# Patient Record
Sex: Male | Born: 1985 | Race: White | Hispanic: No | Marital: Single | Smoking: Current every day smoker
Health system: Southern US, Community
[De-identification: ages and names within clinical notes are randomized; demographics above are authoritative.]

## PROBLEM LIST (undated history)

## (undated) DIAGNOSIS — F319 Bipolar disorder, unspecified: Secondary | ICD-10-CM

---

## 2015-04-16 ENCOUNTER — Emergency Department (HOSPITAL_BASED_OUTPATIENT_CLINIC_OR_DEPARTMENT_OTHER)
Admission: EM | Admit: 2015-04-16 | Discharge: 2015-04-16 | Discharge status: Against Medical Advice | Payer: Self-pay | Attending: Emergency Medicine | Admitting: Emergency Medicine

## 2015-04-16 ENCOUNTER — Encounter (HOSPITAL_BASED_OUTPATIENT_CLINIC_OR_DEPARTMENT_OTHER): Payer: Self-pay | Admitting: Emergency Medicine

## 2015-04-16 DIAGNOSIS — R51 Headache: Secondary | ICD-10-CM | POA: Insufficient documentation

## 2015-04-16 DIAGNOSIS — R1110 Vomiting, unspecified: Secondary | ICD-10-CM | POA: Insufficient documentation

## 2015-04-16 DIAGNOSIS — Z720 Tobacco use: Secondary | ICD-10-CM | POA: Insufficient documentation

## 2015-04-16 HISTORY — DX: Bipolar disorder, unspecified: F31.9

## 2015-04-16 NOTE — ED Notes (Signed)
Patient states that he has a "Migraine Headache". Reports that he has vomited. The patient denies a history of MHA. Patient denies anything that makes it worse or better.

## 2015-04-16 NOTE — ED Notes (Signed)
Went to round on patient. Pt not in room. Registration staff stated that patient left.

## 2015-04-23 ENCOUNTER — Encounter (HOSPITAL_BASED_OUTPATIENT_CLINIC_OR_DEPARTMENT_OTHER): Payer: Self-pay | Admitting: Emergency Medicine

## 2015-04-23 ENCOUNTER — Emergency Department (HOSPITAL_BASED_OUTPATIENT_CLINIC_OR_DEPARTMENT_OTHER)
Admission: EM | Admit: 2015-04-23 | Discharge: 2015-04-23 | Disposition: A | Discharge status: Home / Self Care | Payer: Self-pay | Attending: Emergency Medicine | Admitting: Emergency Medicine

## 2015-04-23 DIAGNOSIS — Z79899 Other long term (current) drug therapy: Secondary | ICD-10-CM | POA: Insufficient documentation

## 2015-04-23 DIAGNOSIS — Z720 Tobacco use: Secondary | ICD-10-CM | POA: Insufficient documentation

## 2015-04-23 DIAGNOSIS — F319 Bipolar disorder, unspecified: Secondary | ICD-10-CM | POA: Insufficient documentation

## 2015-04-23 DIAGNOSIS — K029 Dental caries, unspecified: Secondary | ICD-10-CM | POA: Insufficient documentation

## 2015-04-23 DIAGNOSIS — K0889 Other specified disorders of teeth and supporting structures: Secondary | ICD-10-CM | POA: Insufficient documentation

## 2015-04-23 MED ORDER — BUPIVACAINE-EPINEPHRINE (PF) 0.5% -1:200000 IJ SOLN
10.0000 mL | Freq: Once | INTRAMUSCULAR | Status: AC
  Administered 2015-04-23: 10 mL
  Filled 2015-04-23: qty 10.8

## 2015-04-23 MED ORDER — BUPIVACAINE HCL (PF) 0.5 % IJ SOLN
10.0000 mL | Freq: Once | INTRAMUSCULAR | Status: DC
  Filled 2015-04-23: qty 10

## 2015-04-23 MED ORDER — BUPIVACAINE-EPINEPHRINE (PF) 0.5% -1:200000 IJ SOLN
INTRAMUSCULAR | Status: AC
Start: 2015-04-23 — End: 2015-04-23
  Administered 2015-04-23: 10 mL
  Filled 2015-04-23: qty 1.8

## 2015-04-23 MED ORDER — PENICILLIN V POTASSIUM 500 MG PO TABS: 500.0000 mg | ORAL_TABLET | Freq: Two times a day (BID) | ORAL | Status: AC

## 2015-04-23 NOTE — ED Notes (Signed)
Pt reports dental pain to left back tooth x 8 days

## 2015-04-23 NOTE — ED Notes (Addendum)
C/o left lower back tooth pain, face feels tight, has tried Excedrin and Midwife for pain relief, but pain relief is minimal.

## 2015-04-23 NOTE — Discharge Instructions (Signed)
Please take your prescription as prescribed. Please follow up with your dentist or one listed below. Please return to the Emergency Department if symptoms worsen or new onset of fever, facial swelling, unable to swallow, difficulty breathing.   Emergency Department Resource Guide 1) Find a Doctor and Pay Out of Pocket Although you won't have to find out who is covered by your insurance plan, it is a good idea to ask around and get recommendations. You will then need to call the office and see if the doctor you have chosen will accept you as a new patient and what types of options they offer for patients who are self-pay. Some doctors offer discounts or will set up payment plans for their patients who do not have insurance, but you will need to ask so you aren't surprised when you get to your appointment.  2) Contact Your Local Health Department Not all health departments have doctors that can see patients for sick visits, but many do, so it is worth a call to see if yours does. If you don't know where your local health department is, you can check in your phone book. The CDC also has a tool to help you locate your state's health department, and many state websites also have listings of all of their local health departments.  3) Find a Walk-in Clinic If your illness is not likely to be very severe or complicated, you may want to try a walk in clinic. These are popping up all over the country in pharmacies, drugstores, and shopping centers. They're usually staffed by nurse practitioners or physician assistants that have been trained to treat common illnesses and complaints. They're usually fairly quick and inexpensive. However, if you have serious medical issues or chronic medical problems, these are probably not your best option.  No Primary Care Doctor: - Call Health Connect at  [phone] - they can help you locate a primary care doctor that  accepts your insurance, provides certain services,  etc. - Physician Referral Service- [phone]  Chronic Pain Problems: Organization         Address  Phone   Notes  [facility]  [phone] Patients need to be referred by their primary care doctor.   Medication Assistance: Organization         Address  Phone   Notes  [facility] Medication [facility] [address] [place] [place], [place] [address] [phone] --Must be a resident of [facility] -- Must have NO insurance coverage whatsoever (no Medicaid/ Medicare, etc.) -- The pt. MUST have a primary care doctor that directs their care regularly and follows them in the community   Med[facility]ist  [phone]   [employer]  [phone]    Agencies that provide inexpensive medical care: Organization         Address  Phone   Notes  [facility]  [phone]   [facility]    [phone]   [facility] [address] [place], [place] [address] [phone]   [facility] [address] [place]. [address] [place] [phone]   Planned Parenthood    ([phone]   [facility]    [phone]   [facility]  [address], [place] Phone:  [phone], Fax:  [phone] Hours of Operation:  9 am - 6 pm, M-F.  Also accepts Medicaid/Medicare and self-pay.  [facility] for [place] [address], [address], [place] Phone: [phone], Fax: [phone]. Hours of Operation:  8:30 am - 5:30 pm, M-F.  Also accepts Medicaid and self-pay.  [facility] [address], [place] Phone: [phone]   [place], [place], [place] [phone], Ext. [phone] Mondays & Thursdays: 7-9 AM.  First 15 patients are seen on a first come, first serve basis.    [place] Providers:  Organization         Address  Phone   Notes  [facility] [address], [place] [phone] Also accepts self-pay patients.  [facility] [address] [place], [place]  [phone]   [place], [address], [place] [phone]   [facility] [address] [place] [phone]   Lucianne Lei [address], [place]   [phone] Only accepts [place] Access [place] patients after they have their name applied to their card.   Self-Pay (no insurance) in [facility]:  Organization         Address  Phone   Notes  Sickle Cell Patients, [facility] [place] [phone]   [facility] Urgent Care [place] [phone]   [facility]  [place], [place], [place] [phone]   [facility]/Dr. Osei-Bonsu  [address], [place] or [address], [place] [phone] Phone number for both [place] and [place] locations is the same.  Urgent Medical and [facility] [address] [place] [phone]   [facility] [address], [place] or [address] [phone]   [facility] [address] [place] [phone], phone; [phone], fax Sees patients 1st and 3rd Saturday of every month.  Must not qualify for public or private insurance (i.e. Medicaid, Medicare, [place] Health Choice, Veterans' Benefits)  Household income should be no more than 200% of the poverty level The clinic cannot treat you if you are pregnant or think you are pregnant  Sexually transmitted diseases are not treated at the clinic.    Dental Care: Organization         Address  Phone  Notes  [facility] Department of [facility] [place] [phone] Accepts children up to  age 65 who are enrolled in [place] or [place]; pregnant women with a Medicaid card; and children who have applied for Medicaid or [place] Health Choice, but were declined, whose parents can pay a reduced fee at time of service.  [facility] Department of [facility]  [address], [place] [phone] Accepts children up to age 52 who are enrolled in [place] or [place]; pregnant women with a Medicaid card; and children who have applied for Medicaid or [place] Health Choice, but were declined, whose parents can pay a reduced fee at time of service.  [facility]  [place] [phone] Patients are seen by appointment only. Walk-ins are not accepted. [place] will see patients 71 years of age and older. Monday - Tuesday (8am-5pm) Most Wednesdays (8:30-5pm) $30 per visit,  cash only  [employer] Adult [employer] PROGRAM  [address], [place] [phone] Patients are seen by appointment only. Walk-ins are not accepted. [place] will see patients 33 years of age and older. One Wednesday Evening (Monthly: Volunteer Based).  $30 per visit, cash only  [place]  [phone] for adults; Children under age 46, call Graduate Pediatric Dentistry at [phone]. Children aged 6-14, please call [phone] to request a pediatric application.  Dental services are provided in all areas of dental care including fillings, crowns and bridges, complete and partial dentures, implants, gum treatment, root canals, and extractions. Preventive care is also provided. Treatment is provided to both adults and children. Patients are selected via a lottery and there is often a waiting list.   [facility] [address], [place]  [phone] [email or web address]   [facility] [address] [place], [place] [phone], Ext. [phone] Second and Fourth Thursday of  each month, opens at 6:30 AM; Clinic ends at 9 AM.  Patients are seen on a first-come first-served basis, and a limited number are seen during each clinic.   [facility]  [address] Hillard Danker [place], [place] [phone]   Eligibility Requirements You must have lived in [place], [place], or [place] counties for at least the last three months.   You cannot be eligible for state or federal sponsored [employer], including [employer], [place], or [employer].   You generally cannot be eligible for healthcare insurance through your employer.    How to apply: Eligibility screenings are held every Tuesday and Wednesday afternoon from 1:00 pm until 4:00 pm. You do not need an appointment for the interview!  [facility] [address], [place], [place]   [place]  [place] Department  [place]  [phone]    Behavioral Health Resources in the Community: Intensive Outpatient Programs Organization         Address  Phone  Notes  [place]. [address] [place], [place] [phone]   [facility] Outpatient [address] [place], [place]   ADS: [facility] Drug [facility] [address] [place], [place]   [place] [address]  [place], [place] or [phone]   Substance Abuse Resources Organization         Address  Phone  Notes  Alcohol and Drug Services  [phone]   [place]  [phone]   The [place]   Chinita Pester  [phone]   Residential & Outpatient Substance Abuse Program  [phone]   Psychological Services Organization         Address  Phone  Notes  [facility] [place]  [place]  [phone]   [place] [address]  [place] or [phone]    Mobile Crisis Teams Organization         Address  Phone  Notes  [facility], Mobile Crisis Care Unit  [phone]   [facility]ive Psychotherapeutic Services  [address] [place], [place]   [place] [address] [place] [phone]    Self-Help/Support Groups Organization         Address  Phone  Notes  Mental Health Assoc. of [place] - variety of support groups  336- I7437963 Call for more information  Narcotics Anonymous (NA), [facility]aring [facility] [address], [employer] [place]  2 meetings at this location   Statistician         Address  Phone  Notes  [facility] [address],    [place]  [phone]   [facility]  [address] [place] [address], [place], [place] [phone]   [facility] [address] [place], [place] [phone] Admissions: 8am-3pm M-F  [facility] [address]    [place], [place] [phone]   [facility] [address] [place], [place], [place] [phone]   [facility] [address]  [place], [place] [phone]   [facility] - Intensive Outpatient [address] Laurell Joseph[address], [place], [place] [phone]   [facility] (Addiction Recovery Care [facility] [address] [place].,  [place], [place] [phone] or [phone]   [facility] (RTS) [address] [place], [place] [phone] Accepts Medicaid  [facility] [place] [address]  [place] [phone] Substance Abuse/Addiction Treatment   [facility] Organization         Address  Phone  Notes  [facility]  [phone]   Angie Fava, PhD [address] Ervin [place], [place]   4327316275 or [phone]   [facility] Behavioral   [address] [place], [place] [phone]     [facility] [address], [place], [place] [phone] Insurance/Medicaid/sponsorship through [facility] and Families [address]                                    [place], [place] [phone] Therapy/tele-psych/case  [facility] [address], [place] [phone]    Dr. Lolly Mustache  [phone]   [facility]  [employer] [facility] Dept. 1) [address] [place] 2) [address] [place] 3)  [address], [place] [phone]  [phone]   [facility] Child Abuse Hotline 407-558-7780 or [phone] (After Hours)      [facility] Community Service Learning [facility] [address] [place], [place] [address] Phone [phone]  [facility] [employer] Community Service Learning Center in [place], [place], exemplifies the [employer] vision to improve the health and quality of life of all [place]ians by Public house manager with a passion to care for the underserved and by leading the nation in community-based, service learning oral health education.  We are committed to offering comprehensive general dental services for adults, children and special needs patients in a safe, caring and professional setting.   Appointments: Our clinic is open Monday through Friday 8:00 a.m. until 5:00 p.m. The amount of time scheduled for an appointment depends on the patients specific needs. We ask that you keep your appointed time for care or provide 24-hour notice of all appointment changes. Parents or legal guardians must accompany minor children.   Payment for Services: Medicaid and other insurance plans are welcome. Payment for services is due when services are rendered and may be made by cash or credit card. If you have dental insurance, we  will assist you with your claim submission.     Emergencies:  Emergency services will be provided Monday through Friday on a walk-in basis.  Please arrive early for emergency services. After hours emergency services will be provided for patients of record as required.   Services:  Armed forces operational officer Dentistry Oral Surgery - Extractions Root Canals Sealants and Tooth Colored Fillings Crowns and Bridges Dentures and Partial Dentures Implant Services Periodontal Services and Cleanings Cosmetic Armed forces operational officer 3-D/[facility]

## 2015-04-23 NOTE — ED Provider Notes (Signed)
CSN: [id number]     Arrival date & time 04/23/15  1517 History   First MD Initiated Contact with Patient 04/23/15 1652     Chief Complaint  Patient presents with  . Dental Pain     (Consider location/radiation/quality/duration/timing/severity/associated sxs/prior Treatment) HPI Comments: Pt is a 29 yo male who presents to the ED with complaint of dental pain, onset 8 days. Pt reports worsening pain to his left lower molar with associated HA. Denies fever, chills, facial swelling, dysphagia, SOB, drainage. Pt notes he has had dental pain in the past resulting in having his tooth pulled. He notes he has been taking Excedrin and Goody powder without relief.   Patient is a 29 y.o. male presenting with tooth pain.  Dental Pain Associated symptoms: no drooling and no fever     Past Medical History  Diagnosis Date  . Bipolar 1 disorder (HCC)    History reviewed. No pertinent past surgical history. History reviewed. No pertinent family history. Social History  Substance Use Topics  . Smoking status: Current Every Day Smoker  . Smokeless tobacco: None  . Alcohol Use: No    Review of Systems  Constitutional: Negative for fever and chills.  HENT: Positive for dental problem. Negative for drooling and sore throat.   Respiratory: Negative for shortness of breath and wheezing.   Gastrointestinal: Negative for nausea, vomiting and abdominal pain.  Skin: Negative for rash.      Allergies  Review of patient's allergies indicates no known allergies.  Home Medications   Prior to Admission medications   Medication Sig Start Date End Date Taking? Authorizing Provider  Aspirin-Acetaminophen-Caffeine (GOODY HEADACHE PO) Take by mouth.   Yes Historical Provider, MD  carbamazepine (TEGRETOL XR) 200 MG 12 hr tablet Take 200 mg by mouth 2 (two) times daily.    Historical Provider, MD  sertraline (ZOLOFT) 50 MG tablet Take 50 mg by mouth daily.    Historical Provider, MD   BP 124/77 mmHg   Pulse 71  Temp(Src) 98.3 F (36.8 C) (Oral)  Resp 18  Ht  (1.854 m)  Wt 195 lb (88.451 kg)  BMI 25.73 kg/m2  SpO2 100% Physical Exam  Constitutional: He is oriented to person, place, and time. He appears well-developed and well-nourished.  HENT:  Head: Normocephalic and atraumatic.  Mouth/Throat: Uvula is midline, oropharynx is clear and moist and mucous membranes are normal. No oral lesions. No trismus in the jaw. Abnormal dentition. Dental caries present. No dental abscesses. No oropharyngeal exudate.    Eyes: Conjunctivae and EOM are normal. Right eye exhibits no discharge. Left eye exhibits no discharge. No scleral icterus.  Neck: Normal range of motion. Neck supple.  Cardiovascular: Normal rate.   Pulmonary/Chest: Effort normal.  Lymphadenopathy:    He has no cervical adenopathy.  Neurological: He is alert and oriented to person, place, and time.  Nursing note and vitals reviewed.   ED Course  NERVE BLOCK Date/Time: 04/23/2015 5:46 PM Performed by: Barrett Henle Authorized by: Barrett Henle Consent: Verbal consent obtained. Risks and benefits: risks, benefits and alternatives were discussed Consent given by: patient Patient understanding: patient states understanding of the procedure being performed Patient identity confirmed: verbally with patient Body area: face/mouth Nerve: inferior alveolar Laterality: left Patient sedated: no Patient position: sitting Needle gauge: 25 G Location technique: anatomical landmarks Local anesthetic: bupivacaine 0.5% with epinephrine Anesthetic total: 2 ml Outcome: pain improved Patient tolerance: Patient tolerated the procedure well with no immediate complications   (including  critical care time) Labs Review Labs Reviewed - No data to display  Imaging Review No results found. I have personally reviewed and evaluated these images and lab results as part of my medical decision-making.  Filed Vitals:     04/23/15 1742  BP: 117/70  Pulse: 56  Temp:   Resp: 16   Meds given in ED:  Medications  bupivacaine-epinephrine (MARCAINE W/ EPI) 0.5% -1:200000 injection 10 mL (not administered)    New Prescriptions   PENICILLIN V POTASSIUM (VEETID) 500 MG TABLET    Take 1 tablet (500 mg total) by mouth 2 (two) times daily.    MDM   Final diagnoses:  Pain, dental    Pt presents with worsening left lower dental pain. Denies fever, facial swelling, dysphagia, SOB. No relief with excedrin or Goody powder. VSS. Poor dentition. Decaying left lower molar with no surrounding erythema or swelling of gum. NO abscess. Dental block performed with no complications. Pt reports pain improved. Plan to d/c pt home with rx for penicillin. Pt given dental referral.  Evaluation does not show pathology requring ongoing emergent intervention or admission. Pt is hemodynamically stable and mentating appropriately. Discussed findings/results and plan with patient/guardian, who agrees with plan. All questions answered. Return precautions discussed and outpatient follow up given.      Satira Sark [place], [place] 04/23/15 1754  Elwin Mocha, MD 04/23/15 [phone]

## 2015-04-23 NOTE — ED Notes (Signed)
Pain causing HA also.

## 2015-09-11 ENCOUNTER — Emergency Department (HOSPITAL_BASED_OUTPATIENT_CLINIC_OR_DEPARTMENT_OTHER)
Admission: EM | Admit: 2015-09-11 | Discharge: 2015-09-11 | Disposition: A | Discharge status: Home / Self Care | Payer: No Typology Code available for payment source | Attending: Emergency Medicine | Admitting: Emergency Medicine

## 2015-09-11 ENCOUNTER — Encounter (HOSPITAL_BASED_OUTPATIENT_CLINIC_OR_DEPARTMENT_OTHER): Payer: Self-pay | Admitting: *Deleted

## 2015-09-11 DIAGNOSIS — R6883 Chills (without fever): Secondary | ICD-10-CM | POA: Insufficient documentation

## 2015-09-11 DIAGNOSIS — F319 Bipolar disorder, unspecified: Secondary | ICD-10-CM | POA: Diagnosis not present

## 2015-09-11 DIAGNOSIS — A084 Viral intestinal infection, unspecified: Secondary | ICD-10-CM | POA: Diagnosis not present

## 2015-09-11 DIAGNOSIS — F17210 Nicotine dependence, cigarettes, uncomplicated: Secondary | ICD-10-CM | POA: Diagnosis not present

## 2015-09-11 DIAGNOSIS — Z79899 Other long term (current) drug therapy: Secondary | ICD-10-CM | POA: Insufficient documentation

## 2015-09-11 DIAGNOSIS — R1110 Vomiting, unspecified: Secondary | ICD-10-CM | POA: Diagnosis present

## 2015-09-11 DIAGNOSIS — K2970 Gastritis, unspecified, without bleeding: Secondary | ICD-10-CM

## 2015-09-11 MED ORDER — ACETAMINOPHEN 500 MG PO TABS
1000.0000 mg | ORAL_TABLET | Freq: Once | ORAL | Status: AC
  Administered 2015-09-11: 1000 mg via ORAL
  Filled 2015-09-11: qty 2

## 2015-09-11 MED ORDER — ONDANSETRON 4 MG PO TBDP: 4.0000 mg | ORAL_TABLET | Freq: Three times a day (TID) | ORAL | Status: DC | PRN

## 2015-09-11 MED ORDER — ONDANSETRON 4 MG PO TBDP
4.0000 mg | ORAL_TABLET | Freq: Once | ORAL | Status: AC
  Administered 2015-09-11: 4 mg via ORAL
  Filled 2015-09-11: qty 1

## 2015-09-11 NOTE — ED Notes (Signed)
Pt reports drove to work today and when he got there he felt dizzy and vomited x 3. States dizziness has improved

## 2015-09-11 NOTE — Discharge Instructions (Signed)

## 2015-09-11 NOTE — ED Provider Notes (Signed)
CSN: [id number]     Arrival date & time 09/11/15  1245 History   First MD Initiated Contact with Patient 09/11/15 1316     Chief Complaint  Patient presents with  . Emesis     (Consider location/radiation/quality/duration/timing/severity/associated sxs/prior Treatment) Patient is a 30 y.o. male presenting with vomiting. The history is provided by the patient.  Emesis Severity:  Mild Duration:  3 hours Timing:  Constant Quality:  Stomach contents Progression:  Unchanged Chronicity:  New Recent urination:  Normal Relieved by:  Nothing Worsened by:  Nothing tried Ineffective treatments:  None tried Associated symptoms: abdominal pain (upper after vomiting) and chills   Associated symptoms: no diarrhea   Risk factors: no diabetes     Past Medical History  Diagnosis Date  . Bipolar 1 disorder (HCC)    History reviewed. No pertinent past surgical history. No family history on file. Social History  Substance Use Topics  . Smoking status: Current Every Day Smoker    Types: Cigarettes  . Smokeless tobacco: None  . Alcohol Use: No    Review of Systems  Constitutional: Positive for chills.  Gastrointestinal: Positive for vomiting and abdominal pain (upper after vomiting). Negative for diarrhea.  All other systems reviewed and are negative.     Allergies  Review of patient's allergies indicates no known allergies.  Home Medications   Prior to Admission medications   Medication Sig Start Date End Date Taking? Authorizing Provider  Aspirin-Acetaminophen-Caffeine (GOODY HEADACHE PO) Take by mouth.   Yes Historical Provider, MD  carbamazepine (TEGRETOL XR) 200 MG 12 hr tablet Take 200 mg by mouth 2 (two) times daily.   Yes Historical Provider, MD  sertraline (ZOLOFT) 50 MG tablet Take 50 mg by mouth daily.   Yes Historical Provider, MD   BP 143/86 mmHg  Pulse 81  Temp(Src) 99.1 F (37.3 C) (Oral)  Resp 18  Ht  (1.854 m)  Wt 185 lb (83.915 kg)  BMI 24.41 kg/m2   SpO2 98% Physical Exam  Constitutional: He is oriented to person, place, and time. He appears well-developed and well-nourished. No distress.  HENT:  Head: Normocephalic and atraumatic.  Eyes: Conjunctivae are normal.  Neck: Neck supple. No tracheal deviation present.  Cardiovascular: Normal rate and regular rhythm.   Pulmonary/Chest: Effort normal. No respiratory distress.  Abdominal: Soft. He exhibits no distension. There is no tenderness. There is no rebound and no guarding.  Neurological: He is alert and oriented to person, place, and time.  Skin: Skin is warm and dry.  Psychiatric: He has a normal mood and affect.    ED Course  Procedures (including critical care time) Labs Review Labs Reviewed - No data to display  Imaging Review No results found. I have personally reviewed and evaluated these images and lab results as part of my medical decision-making.   EKG Interpretation None      MDM   Final diagnoses:  Viral gastritis    Patient presents with emesis starting a few hours PTA. Multiple episodes, difficulty holding down food and fluids by mouth, mild epigastric tenderness and pain following onset of vomiting. No fever. MMM, not lethargic appearing, no nuchal rigidity, confusion or obtundation to suggest meningitis. No diarrhea, no suspect food intake, no change in diet. Most likely viral gastritis or other self-limited process by history and clinical appearance. Zofran and po challenge with good relief of symptoms. Patient needs to establish primary care in the area and was provided contact information to do so and  supportive care until likely spontaneous resolution. Return precautions discussed for inability to tolerate po fluids or concerning changes in severity or quality of abdominal pain.      Lyndal Pulley, MD 09/11/15 1414

## 2015-09-30 ENCOUNTER — Emergency Department (HOSPITAL_BASED_OUTPATIENT_CLINIC_OR_DEPARTMENT_OTHER)
Admission: EM | Admit: 2015-09-30 | Discharge: 2015-09-30 | Disposition: A | Discharge status: Home / Self Care | Payer: No Typology Code available for payment source | Attending: Emergency Medicine | Admitting: Emergency Medicine

## 2015-09-30 ENCOUNTER — Encounter (HOSPITAL_BASED_OUTPATIENT_CLINIC_OR_DEPARTMENT_OTHER): Payer: Self-pay | Admitting: *Deleted

## 2015-09-30 DIAGNOSIS — Z7982 Long term (current) use of aspirin: Secondary | ICD-10-CM | POA: Insufficient documentation

## 2015-09-30 DIAGNOSIS — K649 Unspecified hemorrhoids: Secondary | ICD-10-CM

## 2015-09-30 DIAGNOSIS — Z79899 Other long term (current) drug therapy: Secondary | ICD-10-CM | POA: Insufficient documentation

## 2015-09-30 DIAGNOSIS — F319 Bipolar disorder, unspecified: Secondary | ICD-10-CM | POA: Insufficient documentation

## 2015-09-30 DIAGNOSIS — F17210 Nicotine dependence, cigarettes, uncomplicated: Secondary | ICD-10-CM | POA: Insufficient documentation

## 2015-09-30 MED ORDER — PRAMOXINE HCL 1 % RE FOAM: 1.0000 "application " | Freq: Three times a day (TID) | RECTAL | Status: DC | PRN

## 2015-09-30 MED ORDER — DOCUSATE SODIUM 100 MG PO CAPS: 100.0000 mg | ORAL_CAPSULE | Freq: Two times a day (BID) | ORAL | Status: DC

## 2015-09-30 NOTE — Discharge Instructions (Signed)
Hemorrhoids  Hemorrhoids are puffy (swollen) veins around the rectum or anus. Hemorrhoids can cause pain, itching, bleeding, or irritation.  HOME CARE  · Eat foods with fiber, such as whole grains, beans, nuts, fruits, and vegetables. Ask your doctor about taking products with added fiber in them (fiber supplements).   · Drink enough fluid to keep your pee (urine) clear or pale yellow.  · Exercise often.  · Go to the bathroom when you have the urge to poop. Do not wait.  · Avoid straining to poop (bowel movement).  · Keep the butt area dry and clean. Use wet toilet paper or moist paper towels.  · Medicated creams and medicine inserted into the anus (anal suppository) may be used or applied as told.  · Only take medicine as told by your doctor.  · Take a warm water bath (sitz bath) for 15-20 minutes to ease pain. Do this 3-4 times a day.  · Place ice packs on the area if it is tender or puffy. Use the ice packs between the warm water baths.    Put ice in a plastic bag.    Place a towel between your skin and the bag.    Leave the ice on for 15-20 minutes, 03-04 times a day.  · Do not use a donut-shaped pillow or sit on the toilet for a long time.  GET HELP RIGHT AWAY IF:   · You have more pain that is not controlled by treatment or medicine.  · You have bleeding that will not stop.  · You have trouble or are unable to poop (bowel movement).  · You have pain or puffiness outside the area of the hemorrhoids.  MAKE SURE YOU:   · Understand these instructions.  · Will watch your condition.  · Will get help right away if you are not doing well or get worse.     This information is not intended to replace advice given to you by your health care provider. Make sure you discuss any questions you have with your health care provider.     Document Released: 04/17/2008 Document Revised: 06/25/2012 Document Reviewed: 05/20/2012  Elsevier Interactive Patient Education ©2016 [employer]

## 2015-09-30 NOTE — ED Notes (Signed)
Rectal pain. States he is having hemorrhoid pain.

## 2015-09-30 NOTE — ED Provider Notes (Signed)
CSN: [id number]     Arrival date & time 09/30/15  1357 History   First MD Initiated Contact with Patient 09/30/15 1620     Chief Complaint  Patient presents with  . Hemorrhoids    The history is provided by the patient.  Patient presents to the emergency room with complaints of rectal pain associated with hemorrhoids. The patient was having too much rectal discomfort today so had to leave work. He came to the emergency room for evaluation. He has not noticed any bleeding today. He has noticed swelling in the rectal area associated with discomfort. Patient has had trouble with hemorrhoids in the past.  Past Medical History  Diagnosis Date  . Bipolar 1 disorder (HCC)    History reviewed. No pertinent past surgical history. No family history on file. Social History  Substance Use Topics  . Smoking status: Current Every Day Smoker -- 1.00 packs/day    Types: Cigarettes  . Smokeless tobacco: None  . Alcohol Use: No    Review of Systems  All other systems reviewed and are negative.     Allergies  Review of patient's allergies indicates no known allergies.  Home Medications   Prior to Admission medications   Medication Sig Start Date End Date Taking? Authorizing Provider  Aspirin-Acetaminophen-Caffeine (GOODY HEADACHE PO) Take by mouth.    Historical Provider, MD  carbamazepine (TEGRETOL XR) 200 MG 12 hr tablet Take 200 mg by mouth 2 (two) times daily.    Historical Provider, MD  docusate sodium (COLACE) 100 MG capsule Take 1 capsule (100 mg total) by mouth every 12 (twelve) hours. 09/30/15   Linwood DibblesJon Sheniqua Carolan, MD  ondansetron (ZOFRAN ODT) 4 MG disintegrating tablet Take 1 tablet (4 mg total) by mouth every 8 (eight) hours as needed for nausea or vomiting. 09/11/15   Lyndal Pulleyaniel Knott, MD  pramoxine (PROCTOFOAM) 1 % foam Place 1 application rectally 3 (three) times daily as needed for itching. 09/30/15   Linwood DibblesJon Saara Kijowski, MD  sertraline (ZOLOFT) 50 MG tablet Take 50 mg by mouth daily.    Historical  Provider, MD   BP 143/94 mmHg  Pulse 67  Temp(Src) 98.2 F (36.8 C) (Oral)  Resp 18  Ht 6\' 1"  (1.854 m)  Wt 81.647 kg  BMI 23.75 kg/m2  SpO2 100% Physical Exam  Constitutional: He appears well-developed and well-nourished. No distress.  HENT:  Head: Normocephalic and atraumatic.  Right Ear: External ear normal.  Left Ear: External ear normal.  Eyes: Conjunctivae are normal. Right eye exhibits no discharge. Left eye exhibits no discharge. No scleral icterus.  Neck: Neck supple. No tracheal deviation present.  Cardiovascular: Normal rate.   Pulmonary/Chest: Effort normal. No stridor. No respiratory distress.  Abdominal: He exhibits no distension. There is no tenderness. There is no rebound.  Genitourinary: Rectal exam shows external hemorrhoid and tenderness.  External hemorrhoid that is tender to palpation but no evidence of thrombosis, no active bleeding  Musculoskeletal: He exhibits no edema.  Neurological: He is alert. Cranial nerve deficit: no gross deficits.  Skin: Skin is warm and dry. No rash noted.  Psychiatric: He has a normal mood and affect.  Nursing note and vitals reviewed.   ED Course  Procedures (including critical care time)   MDM   Final diagnoses:  Hemorrhoids, unspecified hemorrhoid type    Discussed supportive treatment with sitz baths, consider using aloe gel. Proctofoam and Colace prescription.   Linwood DibblesJon Reana Chacko, MD 09/30/15 [phone]

## 2017-08-16 ENCOUNTER — Other Ambulatory Visit: Payer: Self-pay

## 2017-08-16 ENCOUNTER — Emergency Department (HOSPITAL_COMMUNITY)
Admission: EM | Admit: 2017-08-16 | Discharge: 2017-08-16 | Disposition: A | Discharge status: Home / Self Care | Payer: No Typology Code available for payment source | Attending: Emergency Medicine | Admitting: Emergency Medicine

## 2017-08-16 DIAGNOSIS — F1110 Opioid abuse, uncomplicated: Secondary | ICD-10-CM | POA: Insufficient documentation

## 2017-08-16 DIAGNOSIS — R45851 Suicidal ideations: Secondary | ICD-10-CM | POA: Insufficient documentation

## 2017-08-16 DIAGNOSIS — Z79899 Other long term (current) drug therapy: Secondary | ICD-10-CM | POA: Insufficient documentation

## 2017-08-16 DIAGNOSIS — F17210 Nicotine dependence, cigarettes, uncomplicated: Secondary | ICD-10-CM | POA: Insufficient documentation

## 2017-08-16 DIAGNOSIS — F1920 Other psychoactive substance dependence, uncomplicated: Secondary | ICD-10-CM | POA: Diagnosis present

## 2017-08-16 NOTE — ED Notes (Signed)
Bed: WA28 Expected date:  Expected time:  Means of arrival:  Comments: 

## 2017-08-16 NOTE — Patient Outreach (Signed)
ED Peer Support Specialist Patient Intake (Complete at intake & 30-60 Day Follow-up)  Name: Ricky Atkins  MRN: [id number]  Age: 32 y.o.   Date of Admission: 08/16/2017  Intake: Initial Comments:      Primary Reason Admitted: needs help finding heroin detox  Lab values: Alcohol/ETOH: Not completed Positive UDS? Drug screen not completed Amphetamines: Drug screen not completed Barbiturates: Drug screen not completed Benzodiazepines: Drug screen not completed Cocaine: Drug screen not completed Opiates: Drug screen not completed Cannabinoids: Drug screen not completed  Demographic information: Gender: Male Ethnicity: White Marital Status: Single Insurance Status: Uninsured/Self-pay Control and instrumentation engineereceives non-medical governmental assistance (Work Engineer, agriculturalirst/Welfare, Sales executivefood stamps, etc.: No Lives with: Alone Living situation: Homeless  Reported Patient History: Patient reported health conditions: Depression, Other (comment), Bipolar disorder Patient aware of HIV and hepatitis status: No  In past year, has patient visited ED for any reason? No  Number of ED visits:    Reason(s) for visit:    In past year, has patient been hospitalized for any reason? No  Number of hospitalizations:    Reason(s) for hospitalization:    In past year, has patient been arrested? No  Number of arrests:    Reason(s) for arrest:    In past year, has patient been incarcerated? No  Number of incarcerations:    Reason(s) for incarceration:    In past year, has patient received medication-assisted treatment? No  In past year, patient received the following treatments: Residential treatment (non-hospital)([facility] detox for heroin 6 months)  In past year, has patient received any harm reduction services? No  Did this include any of the following?    In past year, has patient received care from a mental health provider for diagnosis other than SUD? No  In past year, is this first time patient has overdosed? (has not  overdosed)  Number of past overdoses:    In past year, is this first time patient has been hospitalized for an overdose? (has not overdosed)  Number of hospitalizations for overdose(s):    Is patient currently receiving treatment for a mental health diagnosis? No  Patient reports experiencing difficulty participating in SUD treatment: No    Most important reason(s) for this difficulty?    Has patient received prior services for treatment? Yes([facility] for detox 8 months ago)  In past, patient has received services from following agencies:    Plan of Care:  Suggested follow up at these agencies/treatment centers: ([facility] or [facility] for detox for heroin. Patient [facility], but he was not accepted. )  Other information: Patient had a screening assessment with [facility] in [place]. Patient needs medical clearance before he can get accepted.    Bartholomew BoardsJohn Odaly Peri, CPSS  08/16/2017 4:27 PM

## 2017-08-16 NOTE — ED Provider Notes (Signed)
[facility]-EMERGENCY DEPT Provider Note   CSN: [id number] Arrival date & time: 08/16/17  1445     History   Chief Complaint Chief Complaint  Patient presents with  . Suicidal    detox off of herion     HPI Ricky Atkins is a 32 y.o. male with past medical history of bipolar 1 disorder, heroin abuse, presenting to the ED for detox from heroin.  Triage chief complaint notes suicidal ideations, however during evaluation, patient denies any thoughts of harming himself or ending his life.  He states he strongly wants to discontinue drug use, and is "over the way of life" associated with drug dependence.  States he does not have any thoughts of harming himself, and does not have any plan to end his life. He is afraid of a future with drug dependence due to what could happen to his life, not because he wants to end his life. Denies homicidal ideations. Reports last drug use was yesterday. Denies other drug use, or alcohol use. Denies any medical complaints today.  The history is provided by the patient.    Past Medical History:  Diagnosis Date  . Bipolar 1 disorder (HCC)     There are no active problems to display for this patient.   No past surgical history on file.     Home Medications    Prior to Admission medications   Medication Sig Start Date End Date Taking? Authorizing Provider  Aspirin-Acetaminophen-Caffeine (GOODY HEADACHE PO) Take by mouth.    [provider]  carbamazepine (TEGRETOL XR) 200 MG 12 hr tablet Take 200 mg by mouth 2 (two) times daily.    [provider]  docusate sodium (COLACE) 100 MG capsule Take 1 capsule (100 mg total) by mouth every 12 (twelve) hours. 09/30/15   Linwood Dibbles, MD  ondansetron (ZOFRAN ODT) 4 MG disintegrating tablet Take 1 tablet (4 mg total) by mouth every 8 (eight) hours as needed for nausea or vomiting. 09/11/15   Lyndal Pulley, MD  pramoxine (PROCTOFOAM) 1 % foam Place 1 application rectally 3  (three) times daily as needed for itching. 09/30/15   Linwood Dibbles, MD  sertraline (ZOLOFT) 50 MG tablet Take 50 mg by mouth daily.    [provider]    Family History No family history on file.  Social History Social History   Tobacco Use  . Smoking status: Current Every Day Smoker    Packs/day: 1.00    Types: Cigarettes  Substance Use Topics  . Alcohol use: No  . Drug use: No     Allergies   Patient has no known allergies.   Review of Systems Review of Systems  Psychiatric/Behavioral: Negative for hallucinations and suicidal ideas.  All other systems reviewed and are negative.    Physical Exam Updated Vital Signs There were no vitals taken for this visit.  Physical Exam  Constitutional: He is oriented to person, place, and time. He appears well-developed and well-nourished. No distress.  HENT:  Head: Normocephalic and atraumatic.  Eyes: Conjunctivae are normal.  Cardiovascular: Normal rate and intact distal pulses.  Pulmonary/Chest: Effort normal.  Abdominal: Soft.  Neurological: He is alert and oriented to person, place, and time.  Skin: Skin is warm.  Injection sites on dorsum of right hand, and left forearm. No erythema, warmth, or fluctuance.   Psychiatric: He has a normal mood and affect. His speech is normal and behavior is normal. Thought content normal. He expresses no homicidal and no suicidal  ideation.  Calm, cooperative, answering questions appropriately.   Nursing note and vitals reviewed.    ED Treatments / Results  Labs (all labs ordered are listed, but only abnormal results are displayed) Labs Reviewed - No data to display  EKG  EKG Interpretation None       Radiology No results found.  Procedures Procedures (including critical care time)  Medications Ordered in ED Medications - No data to display   Initial Impression / Assessment and Plan / ED Course  I have reviewed the triage vital signs and the nursing  notes.  Pertinent labs & imaging results that were available during my care of the patient were reviewed by me and considered in my medical decision making (see chart for details).  Clinical Course as of Aug 17 1707  Fri Aug 16, 2017  1635 [facility] accepting pt tonight for detox.  [JR]    Clinical Course User Index [JR] Kelechi Orgeron, [place] N, PA-C    Pt presenting to ED for heroin detox. Strongly denies any suicidal thoughts. I do not get impression based on evaluation that patient is suicidal. He is calm and appropriate. Requesting help with substance abuse and wants to change his life for the better. No medical complaints today. No evidence of skin infection at injection sites. Per peer counseling, [facility] has accepted patient tonight for inpatient detox/rehab from heroin. Pt is medically cleared for inpatient substance abuse program.  Discussed results, findings, treatment and follow up. Patient advised of return precautions. Patient verbalized understanding and agreed with plan.  Final Clinical Impressions(s) / ED Diagnoses   Final diagnoses:  Heroin abuse [facility](HCC)    ED Discharge Orders    None       Roslind Michaux, [place] N, PA-C 08/16/17 1759    Benjiman CorePickering, Nathan, MD 08/17/17 [phone]

## 2017-08-16 NOTE — Patient Outreach (Signed)
Patient was [facility]. Patient's mom is picking him up at 6:00pm. [facility] for his admission.

## 2017-12-22 DIAGNOSIS — Z79899 Other long term (current) drug therapy: Secondary | ICD-10-CM | POA: Insufficient documentation

## 2017-12-22 DIAGNOSIS — L02414 Cutaneous abscess of left upper limb: Secondary | ICD-10-CM | POA: Insufficient documentation

## 2017-12-22 DIAGNOSIS — F17210 Nicotine dependence, cigarettes, uncomplicated: Secondary | ICD-10-CM | POA: Insufficient documentation

## 2017-12-23 ENCOUNTER — Encounter (HOSPITAL_COMMUNITY): Payer: Self-pay | Admitting: *Deleted

## 2017-12-23 ENCOUNTER — Emergency Department (HOSPITAL_COMMUNITY)
Admission: EM | Admit: 2017-12-23 | Discharge: 2017-12-23 | Disposition: A | Discharge status: Home / Self Care | Payer: No Typology Code available for payment source | Attending: Emergency Medicine | Admitting: Emergency Medicine

## 2017-12-23 ENCOUNTER — Other Ambulatory Visit: Payer: Self-pay

## 2017-12-23 DIAGNOSIS — L0291 Cutaneous abscess, unspecified: Secondary | ICD-10-CM

## 2017-12-23 MED ORDER — LIDOCAINE HCL (PF) 1 % IJ SOLN
5.0000 mL | Freq: Once | INTRAMUSCULAR | Status: AC
  Administered 2017-12-23: 5 mL via INTRADERMAL
  Filled 2017-12-23: qty 30

## 2017-12-23 MED ORDER — CLINDAMYCIN HCL 300 MG PO CAPS
300.0000 mg | ORAL_CAPSULE | Freq: Once | ORAL | Status: AC
  Administered 2017-12-23: 300 mg via ORAL
  Filled 2017-12-23: qty 1

## 2017-12-23 MED ORDER — CLINDAMYCIN HCL 150 MG PO CAPS: 300.0000 mg | ORAL_CAPSULE | Freq: Three times a day (TID) | ORAL | 0 refills | Status: DC

## 2017-12-23 MED ORDER — LIDOCAINE-EPINEPHRINE-TETRACAINE (LET) SOLUTION
3.0000 mL | Freq: Once | NASAL | Status: AC
  Administered 2017-12-23: 3 mL via TOPICAL
  Filled 2017-12-23: qty 3

## 2017-12-23 NOTE — ED Provider Notes (Signed)
[facility]-EMERGENCY DEPT Provider Note   CSN: [id number] Arrival date & time: 12/22/17  2319     History   Chief Complaint Chief Complaint  Patient presents with  . Abscess    LUE    HPI Ricky Atkins is a 32 y.o. male.  The history is provided by the patient and medical records.  Abscess      32 year old male with history of bipolar disorder, presenting to the ED for abscess of left upper arm near his axilla.  States he first noticed this about a week ago but has been steadily increasing in size and becoming more painful.  He denies any fever or chills.  He has no history of abscesses or MRSA.  Has tried warm compresses at home without relief.  No drainage or bleeding.    Past Medical History:  Diagnosis Date  . Bipolar 1 disorder [facility](HCC)     Patient Active Problem List   Diagnosis Date Noted  . Polysubstance dependence including opioid type drug without complication, episodic abuse (HCC) 08/16/2017    History reviewed. No pertinent surgical history.      Home Medications    Prior to Admission medications   Medication Sig Start Date End Date Taking? Authorizing Provider  Aspirin-Acetaminophen-Caffeine (GOODY HEADACHE PO) Take by mouth.    [provider]  carbamazepine (TEGRETOL XR) 200 MG 12 hr tablet Take 200 mg by mouth 2 (two) times daily.    [provider]  docusate sodium (COLACE) 100 MG capsule Take 1 capsule (100 mg total) by mouth every 12 (twelve) hours. 09/30/15   Linwood DibblesKnapp, Jon, MD  ondansetron (ZOFRAN ODT) 4 MG disintegrating tablet Take 1 tablet (4 mg total) by mouth every 8 (eight) hours as needed for nausea or vomiting. 09/11/15   Lyndal PulleyKnott, Daniel, MD  pramoxine (PROCTOFOAM) 1 % foam Place 1 application rectally 3 (three) times daily as needed for itching. 09/30/15   Linwood DibblesKnapp, Jon, MD  sertraline (ZOLOFT) 50 MG tablet Take 50 mg by mouth daily.    [provider]    Family History No family history on  file.  Social History Social History   Tobacco Use  . Smoking status: Current Every Day Smoker    Packs/day: 1.00    Types: Cigarettes  Substance Use Topics  . Alcohol use: No  . Drug use: No     Allergies   Patient has no known allergies.   Review of Systems Review of Systems  Skin: Positive for wound (abscess).  All other systems reviewed and are negative.    Physical Exam Updated Vital Signs BP 137/90 (BP Location: Right Arm)   Pulse 88   Temp 98.6 F (37 C) (Oral)   Resp 18   SpO2 100%   Physical Exam  Constitutional: He is oriented to person, place, and time. He appears well-developed and well-nourished.  HENT:  Head: Normocephalic and atraumatic.  Mouth/Throat: Oropharynx is clear and moist.  Eyes: Pupils are equal, round, and reactive to light. Conjunctivae and EOM are normal.  Neck: Normal range of motion.  Cardiovascular: Normal rate, regular rhythm and normal heart sounds.  Pulmonary/Chest: Effort normal and breath sounds normal. No stridor. No respiratory distress.  Abdominal: Soft. Bowel sounds are normal. There is no tenderness. There is no rebound.  Musculoskeletal: Normal range of motion.  Abscess of left upper medial arm near axilla; locally tender; no active drainage; central fluctuance; very small amount of streaking towards the axilla otherwise surrounding skin is normal  in appearance  Neurological: He is alert and oriented to person, place, and time.  Skin: Skin is warm and dry.  Psychiatric: He has a normal mood and affect.  Nursing note and vitals reviewed.    ED Treatments / Results  Labs (all labs ordered are listed, but only abnormal results are displayed) Labs Reviewed - No data to display  EKG None  Radiology No results found.  Procedures Procedures (including critical care time)  INCISION AND DRAINAGE Performed by: Garlon Hatchet Consent: Verbal consent obtained. Risks and benefits: risks, benefits and alternatives  were discussed Type: abscess  Body area: left upper arm/axilla  Anesthesia: local infiltration  Incision was made with a scalpel.  Local anesthetic: lidocaine 1% without epinephrine  Anesthetic total: 5 ml  Complexity: complex Blunt dissection to break up loculations  Drainage: purulent  Drainage amount: large  Packing material: none  Patient tolerance: Patient tolerated the procedure well with no immediate complications.     Medications Ordered in ED Medications  clindamycin (CLEOCIN) capsule 300 mg (has no administration in time range)  lidocaine-EPINEPHrine-tetracaine (LET) solution (3 mLs Topical Given 12/23/17 0050)  lidocaine (PF) (XYLOCAINE) 1 % injection 5 mL (5 mLs Intradermal Given by Other 12/23/17 0051)     Initial Impression / Assessment and Plan / ED Course  I have reviewed the triage vital signs and the nursing notes.  Pertinent labs & imaging results that were available during my care of the patient were reviewed by me and considered in my medical decision making (see chart for details).  32 year old male here with abscess of left upper arm/axilla.  This is been present for 1 week, increasing in size.  No drainage or bleeding.  No history of same.  No history of MRSA, diabetes, or HIV.  Abscess moderate in size, central fluctuance without active drainage or bleeding.  There is a small amount of streaking into the axilla but none down the arm.  He is afebrile and nontoxic in appearance.  I&D performed here, large amount of purulent drainage.  Patient will be started on clindamycin, encourage warm compresses.  We will have him follow-up with urgent care.  Final Clinical Impressions(s) / ED Diagnoses   Final diagnoses:  Abscess    ED Discharge Orders        Ordered    clindamycin (CLEOCIN) 150 MG capsule  3 times daily     12/23/17 0122       Garlon Hatchet, PA-C 12/23/17 0128    Gilda Crease, MD 12/23/17 [phone]

## 2017-12-23 NOTE — ED Notes (Signed)
Bed: WA07 Expected date:  Expected time:  Means of arrival:  Comments: 

## 2017-12-23 NOTE — ED Triage Notes (Signed)
Pt c/o abscess to RUE since Friday.

## 2017-12-23 NOTE — Discharge Instructions (Signed)
Take the prescribed medication as directed.  Can do warm compresses at home to help with healing. Follow-up with urgent care to make sure area is improving. Return to the ED for new or worsening symptoms-- high fever, streaking down the arm, swelling, etc.

## 2018-05-29 ENCOUNTER — Other Ambulatory Visit: Payer: Self-pay

## 2018-05-29 ENCOUNTER — Emergency Department (HOSPITAL_COMMUNITY): Payer: No Typology Code available for payment source

## 2018-05-29 ENCOUNTER — Emergency Department (HOSPITAL_COMMUNITY)
Admission: EM | Admit: 2018-05-29 | Discharge: 2018-05-29 | Disposition: A | Discharge status: Home / Self Care | Payer: No Typology Code available for payment source | Attending: Emergency Medicine | Admitting: Emergency Medicine

## 2018-05-29 ENCOUNTER — Encounter (HOSPITAL_COMMUNITY): Payer: Self-pay | Admitting: Emergency Medicine

## 2018-05-29 DIAGNOSIS — L03113 Cellulitis of right upper limb: Secondary | ICD-10-CM | POA: Insufficient documentation

## 2018-05-29 DIAGNOSIS — R600 Localized edema: Secondary | ICD-10-CM

## 2018-05-29 DIAGNOSIS — R748 Abnormal levels of other serum enzymes: Secondary | ICD-10-CM | POA: Insufficient documentation

## 2018-05-29 DIAGNOSIS — F17210 Nicotine dependence, cigarettes, uncomplicated: Secondary | ICD-10-CM | POA: Insufficient documentation

## 2018-05-29 DIAGNOSIS — Z79899 Other long term (current) drug therapy: Secondary | ICD-10-CM | POA: Insufficient documentation

## 2018-05-29 LAB — CBC WITH DIFFERENTIAL/PLATELET
ABS IMMATURE GRANULOCYTES: 0.05 10*3/uL (ref 0.00–0.07)
BASOS ABS: 0.1 10*3/uL (ref 0.0–0.1)
Basophils Relative: 1 %
Eosinophils Absolute: 0.2 10*3/uL (ref 0.0–0.5)
Eosinophils Relative: 2 %
HCT: 36.9 % — ABNORMAL LOW (ref 39.0–52.0)
Hemoglobin: 11.7 g/dL — ABNORMAL LOW (ref 13.0–17.0)
IMMATURE GRANULOCYTES: 1 %
Lymphocytes Relative: 25 %
Lymphs Abs: 2.7 10*3/uL (ref 0.7–4.0)
MCH: 28.1 pg (ref 26.0–34.0)
MCHC: 31.7 g/dL (ref 30.0–36.0)
MCV: 88.5 fL (ref 80.0–100.0)
Monocytes Absolute: 1 10*3/uL (ref 0.1–1.0)
Monocytes Relative: 9 %
NEUTROS ABS: 6.6 10*3/uL (ref 1.7–7.7)
NRBC: 0 % (ref 0.0–0.2)
Neutrophils Relative %: 62 %
Platelets: 118 10*3/uL — ABNORMAL LOW (ref 150–400)
RBC: 4.17 MIL/uL — AB (ref 4.22–5.81)
RDW: 14.9 % (ref 11.5–15.5)
WBC: 10.5 10*3/uL (ref 4.0–10.5)

## 2018-05-29 LAB — COMPREHENSIVE METABOLIC PANEL
ALBUMIN: 3.1 g/dL — AB (ref 3.5–5.0)
ALK PHOS: 43 U/L (ref 38–126)
ALT: 99 U/L — AB (ref 0–44)
ANION GAP: 9 (ref 5–15)
AST: 92 U/L — AB (ref 15–41)
BUN: 9 mg/dL (ref 6–20)
CO2: 28 mmol/L (ref 22–32)
Calcium: 8.4 mg/dL — ABNORMAL LOW (ref 8.9–10.3)
Chloride: 105 mmol/L (ref 98–111)
Creatinine, Ser: 1.08 mg/dL (ref 0.61–1.24)
GFR calc Af Amer: >60 mL/min (ref >60)
GFR calc non Af Amer: >60 mL/min (ref >60)
GLUCOSE: 104 mg/dL — AB (ref 70–99)
Potassium: 3 mmol/L — ABNORMAL LOW (ref 3.5–5.1)
SODIUM: 142 mmol/L (ref 135–145)
Total Bilirubin: 0.7 mg/dL (ref 0.3–1.2)
Total Protein: 5.7 g/dL — ABNORMAL LOW (ref 6.5–8.1)

## 2018-05-29 LAB — CK: Total CK: 1915 U/L — ABNORMAL HIGH (ref 49–397)

## 2018-05-29 LAB — I-STAT CG4 LACTIC ACID, ED: Lactic Acid, Venous: 1.22 mmol/L (ref 0.5–1.9)

## 2018-05-29 MED ORDER — CLINDAMYCIN HCL 300 MG PO CAPS: 300.0000 mg | ORAL_CAPSULE | Freq: Four times a day (QID) | ORAL | 0 refills | Status: DC

## 2018-05-29 MED ORDER — SODIUM CHLORIDE 0.9 % IV BOLUS
1000.0000 mL | Freq: Once | INTRAVENOUS | Status: AC
  Administered 2018-05-29: 1000 mL via INTRAVENOUS

## 2018-05-29 MED ORDER — CLINDAMYCIN PHOSPHATE 600 MG/50ML IV SOLN
600.0000 mg | Freq: Once | INTRAVENOUS | Status: AC
  Administered 2018-05-29: 600 mg via INTRAVENOUS
  Filled 2018-05-29: qty 50

## 2018-05-29 NOTE — ED Triage Notes (Addendum)
Patient reports he was d/c on Monday after heroin OD. Reports he was down for approximately seven hours prior to transport and intubated during admission. C/o swelling to entire right arm where PICC was placed. Also c/o generalized right side pain and swelling to right foot today.

## 2018-05-29 NOTE — Discharge Instructions (Addendum)
Take clindamycin four times daily for a week. Stop taking augmentin.   You likely had an IV infiltration of the right arm or early skin infection. Try elevate your arm.   Your muscle enzyme (CK) is 1900 now. Repeat CK level with your doctor in a week.   See wellness clinic or your doctor for follow up   Return to ER if you have worse swelling, fever, purulent drainage

## 2018-05-29 NOTE — ED Provider Notes (Signed)
[facility]-EMERGENCY DEPT Provider Note   CSN: [id number] Arrival date & time: 05/29/18  1945     History   Chief Complaint Chief Complaint  Patient presents with  . Arm Pain    HPI Ricky Atkins is a 32 y.o. male hx of bipolar, polysubstance use here presenting with right arm swelling.  Patient states that he had a drug out of dose about a week ago and went to [facility].  Upon review of records, patient was intubated and had aspiration pneumonia and rhabdomyolysis.  Patient also might have infiltration of the IV on the right arm and patient was sent home with Augmentin.  Patient states that since discharge several days ago, he has been having some drainage and discharge from the right arm IV site.  Denies any fevers or chills.  He has persistent cough but not getting worse.  Patient states that he has not used heroin since discharge.  The history is provided by the patient.    Past Medical History:  Diagnosis Date  . Bipolar 1 disorder [facility])     Patient Active Problem List   Diagnosis Date Noted  . Polysubstance dependence including opioid type drug without complication, episodic abuse (HCC) 08/16/2017    History reviewed. No pertinent surgical history.      Home Medications    Prior to Admission medications   Medication Sig Start Date End Date Taking? Authorizing Provider  amoxicillin-clavulanate (AUGMENTIN) 875-125 MG tablet Take 1 tablet by mouth 2 (two) times daily.   Yes [provider]  FLUoxetine (PROZAC) 10 MG tablet Take 10 mg by mouth daily.   Yes [provider]  clindamycin (CLEOCIN) 150 MG capsule Take 2 capsules (300 mg total) by mouth 3 (three) times daily. May dispense as 150mg  capsules Patient not taking: Reported on 05/29/2018 12/23/17   Garlon Hatchet, PA-C  docusate sodium (COLACE) 100 MG capsule Take 1 capsule (100 mg total) by mouth every 12 (twelve) hours. Patient not taking: Reported on 05/29/2018  09/30/15   Linwood Dibbles, MD  ondansetron (ZOFRAN ODT) 4 MG disintegrating tablet Take 1 tablet (4 mg total) by mouth every 8 (eight) hours as needed for nausea or vomiting. Patient not taking: Reported on 05/29/2018 09/11/15   Lyndal Pulley, MD  pramoxine (PROCTOFOAM) 1 % foam Place 1 application rectally 3 (three) times daily as needed for itching. Patient not taking: Reported on 05/29/2018 09/30/15   Linwood Dibbles, MD    Family History No family history on file.  Social History Social History   Tobacco Use  . Smoking status: Current Every Day Smoker    Packs/day: 1.00    Types: Cigarettes  Substance Use Topics  . Alcohol use: No  . Drug use: No     Allergies   Patient has no known allergies.   Review of Systems Review of Systems  Musculoskeletal:       R arm pain and swelling   All other systems reviewed and are negative.    Physical Exam Updated Vital Signs BP 126/85   Pulse 79   Temp 98.8 F (37.1 C) (Oral)   Resp 18   SpO2 100%   Physical Exam  Constitutional: He appears well-nourished.  Slightly uncomfortable   HENT:  Head: Normocephalic.  Mouth/Throat: Oropharynx is clear and moist.  Eyes: Pupils are equal, round, and reactive to light. Conjunctivae and EOM are normal.  Neck: Normal range of motion. Neck supple.  Cardiovascular: Normal rate, regular rhythm and  normal heart sounds.  Pulmonary/Chest: Effort normal.  Diminished bilateral bases   Abdominal: Soft. Bowel sounds are normal. He exhibits no distension. There is no tenderness.  Musculoskeletal: Normal range of motion.  R arm edematous. R brachial area with redness and some yellowish discharge. No obvious fluctuance. 2+ radial pulse   Neurological: He is alert.  Skin: Skin is warm. Capillary refill takes less than 2 seconds. There is erythema.  Psychiatric: He has a normal mood and affect.  Nursing note and vitals reviewed.      ED Treatments / Results  Labs (all labs ordered are listed, but  only abnormal results are displayed) Labs Reviewed  CK - Abnormal; Notable for the following components:      Result Value   Total CK 1,915 (*)    All other components within normal limits  CBC WITH DIFFERENTIAL/PLATELET - Abnormal; Notable for the following components:   RBC 4.17 (*)    Hemoglobin 11.7 (*)    HCT 36.9 (*)    Platelets 118 (*)    All other components within normal limits  COMPREHENSIVE METABOLIC PANEL - Abnormal; Notable for the following components:   Potassium 3.0 (*)    Glucose, Bld 104 (*)    Calcium 8.4 (*)    Total Protein 5.7 (*)    Albumin 3.1 (*)    AST 92 (*)    ALT 99 (*)    All other components within normal limits  CULTURE, BLOOD (ROUTINE X 2)  CULTURE, BLOOD (ROUTINE X 2)  I-STAT CG4 LACTIC ACID, ED  I-STAT CG4 LACTIC ACID, ED    EKG None  Radiology Dg Chest 2 View  Result Date: 05/29/2018 CLINICAL DATA:  Cough.  Recent pneumonia. EXAM: CHEST - 2 VIEW COMPARISON:  None. FINDINGS: Increased haziness over the right chest greater than the left. No focal infiltrate. Small effusions identified on the lateral view. No other acute abnormalities. IMPRESSION: Increased interstitial opacities, right greater than left are suspicious for an atypical infection/pneumonia. Bronchitic changes possible. Small effusions may be reactive. Electronically Signed   By: Gerome Sam III M.D   On: 05/29/2018 21:56    Procedures Procedures (including critical care time)  Angiocath insertion Performed by: Richardean Canal  Consent: Verbal consent obtained. Risks and benefits: risks, benefits and alternatives were discussed Time out: Immediately prior to procedure a "time out" was called to verify the correct patient, procedure, equipment, support staff and site/side marked as required.  Preparation: Patient was prepped and draped in the usual sterile fashion.  Vein Location: L antecube  Ultrasound Guided  Gauge: 20 long   Normal blood return and flush without  difficulty Patient tolerance: Patient tolerated the procedure well with no immediate complications.  EMERGENCY DEPARTMENT US SOFT TISSUE INTERPRETATION "Study: Limited Soft Tissue Ultrasound"  INDICATIONS: Pain Multiple views of the body part were obtained in real-time with a multi-frequency linear probe  PERFORMED BY: Myself IMAGES ARCHIVED?: Yes SIDE:Right  BODY PART:Upper extremity INTERPRETATION:  No abcess noted and Cellulitis present possible subcutaneous fluid       Medications Ordered in ED Medications  sodium chloride 0.9 % bolus 1,000 mL (1,000 mLs Intravenous New Bag/Given 05/29/18 2227)  clindamycin (CLEOCIN) IVPB 600 mg (600 mg Intravenous New Bag/Given 05/29/18 2228)     Initial Impression / Assessment and Plan / ED Course  I have reviewed the triage vital signs and the nursing notes.  Pertinent labs & imaging results that were available during my care of the patient were  reviewed by me and considered in my medical decision making (see chart for details).    Ricky Atkins is a 32 y.o. male here with R arm swelling and redness. He was recently intubated and was in the hospital. Likely had IV infiltration. I performed bedside US and there is diffuse soft tissue edema. No obvious fluid collection consistent with abscess and there is no obvious thrombophlebitis. I think likely cellulitis vs IV infiltration. Will get labs, repeat CK, lactate, cultures. He is on augmentin but is afebrile and will try clindamycin for cellulitis.   11:20 PM Patient's labs showed nl WBC, nl lactate. CK is 1915. But upon review of records from [facility], his CK was 8000 3 days ago so its coming down appropriately. CXR unchanged from previous. Given clindamycin and will discharge with the same. I still think likely IV infiltration vs early cellulitis. If he has worsening redness or drainage or fever, can return to the ED for reassessment    Final Clinical Impressions(s) / ED Diagnoses    Final diagnoses:  None    ED Discharge Orders    None       Charlynne Pander, MD 05/29/18 2322

## 2018-06-04 LAB — CULTURE, BLOOD (ROUTINE X 2)
Culture: NO GROWTH
Special Requests: ADEQUATE

## 2019-06-15 IMAGING — CR DG CHEST 2V
2 series · 2 of 2 positions shown · non-contrast
Comparison: None.

CLINICAL DATA: Cough.  Recent pneumonia.

EXAM:
CHEST - 2 VIEW

[w chest pa]
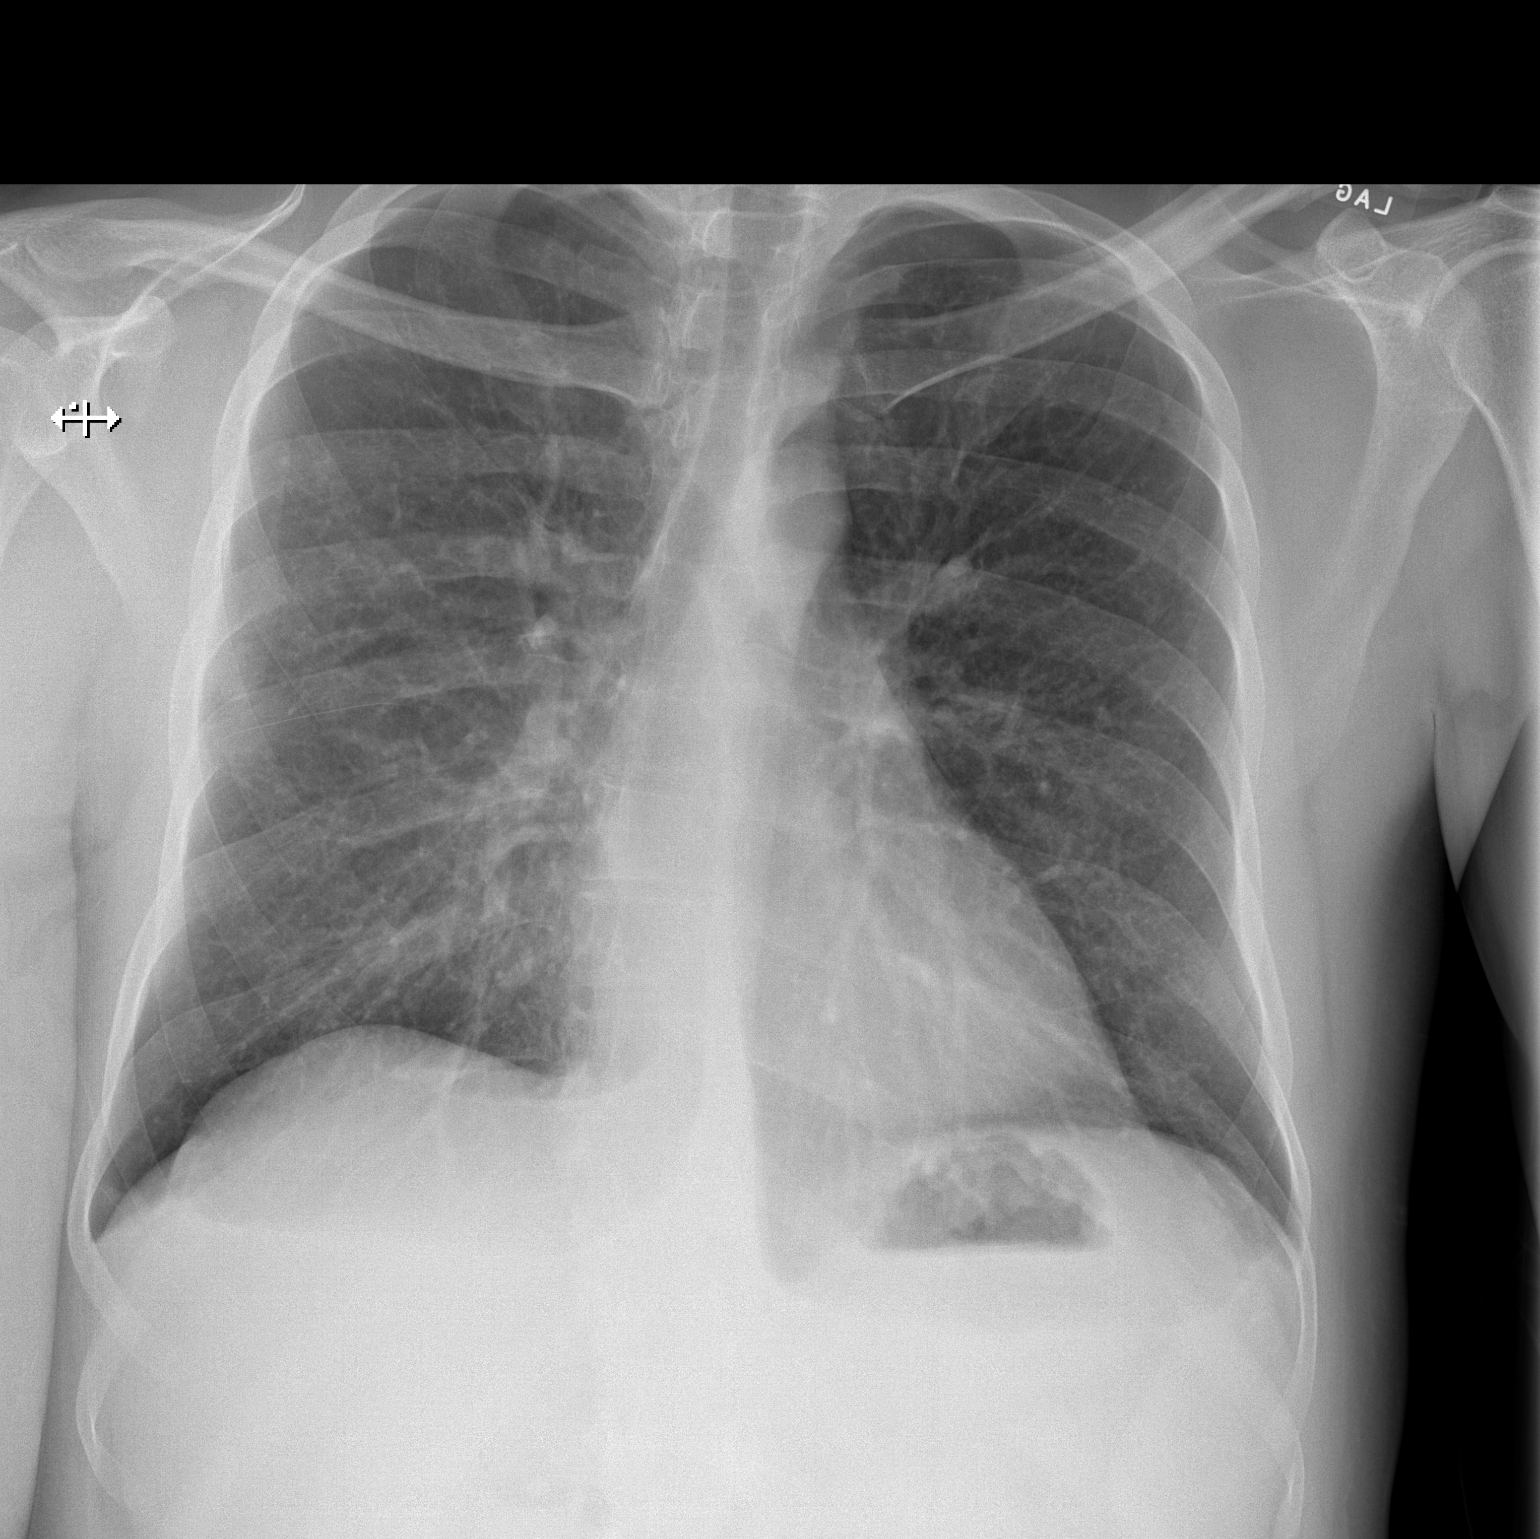

[w chest lat]
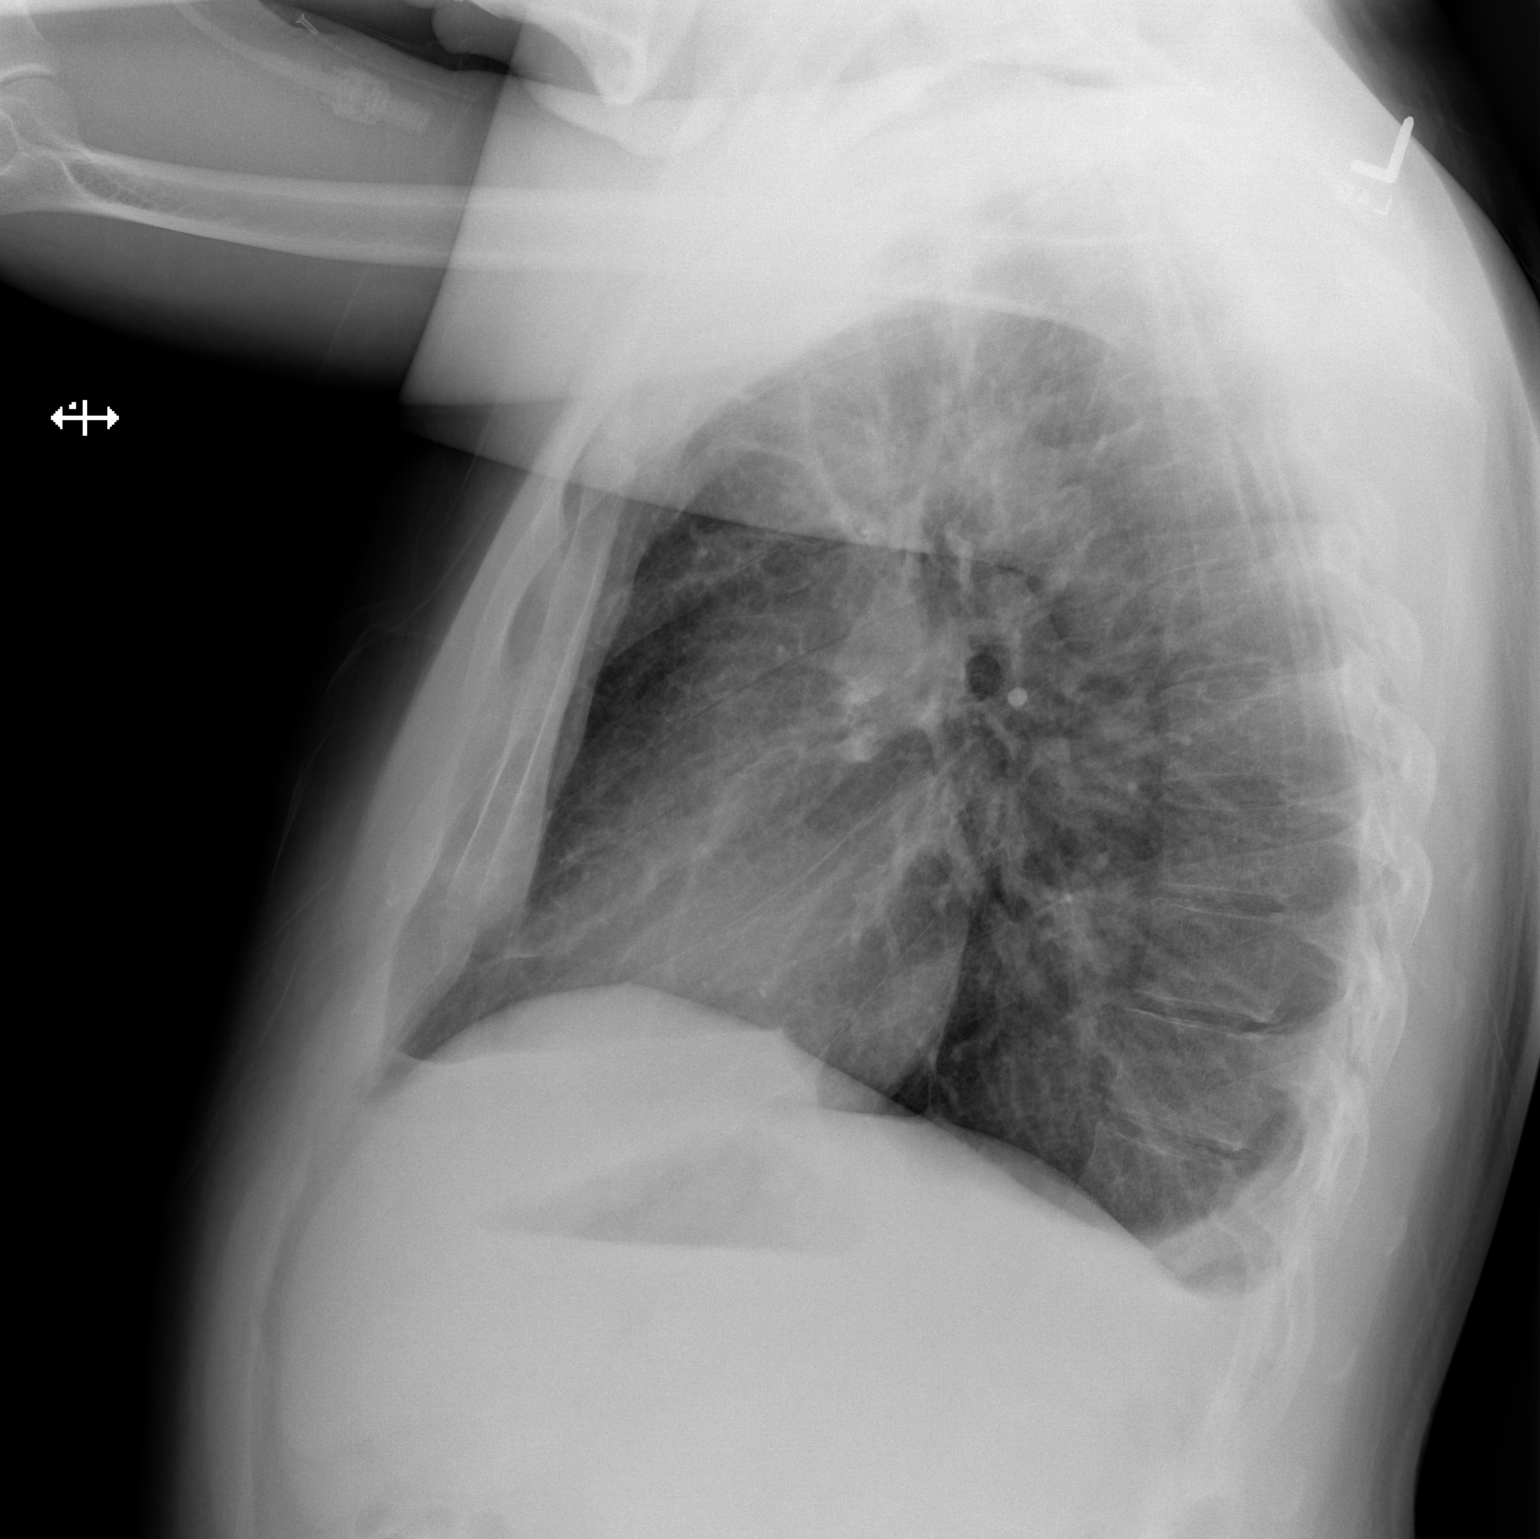

[2 of 2 positions shown; findings below may reference images not displayed]

FINDINGS: Increased haziness over the right chest greater than the left. No
focal infiltrate. Small effusions identified on the lateral view. No
other acute abnormalities.
IMPRESSION: Increased interstitial opacities, right greater than left are
suspicious for an atypical infection/pneumonia. Bronchitic changes
possible. Small effusions may be reactive.

## 2021-11-07 ENCOUNTER — Other Ambulatory Visit: Payer: Self-pay

## 2021-11-07 ENCOUNTER — Encounter (HOSPITAL_BASED_OUTPATIENT_CLINIC_OR_DEPARTMENT_OTHER): Payer: Self-pay

## 2021-11-07 ENCOUNTER — Observation Stay (HOSPITAL_BASED_OUTPATIENT_CLINIC_OR_DEPARTMENT_OTHER)
Admission: EM | Admit: 2021-11-07 | Discharge: 2021-11-09 | Disposition: A | Discharge status: Home / Self Care | Payer: Self-pay | Attending: Internal Medicine | Admitting: Internal Medicine

## 2021-11-07 DIAGNOSIS — D62 Acute posthemorrhagic anemia: Secondary | ICD-10-CM | POA: Insufficient documentation

## 2021-11-07 DIAGNOSIS — F17210 Nicotine dependence, cigarettes, uncomplicated: Secondary | ICD-10-CM | POA: Insufficient documentation

## 2021-11-07 DIAGNOSIS — R739 Hyperglycemia, unspecified: Secondary | ICD-10-CM | POA: Diagnosis present

## 2021-11-07 DIAGNOSIS — K449 Diaphragmatic hernia without obstruction or gangrene: Secondary | ICD-10-CM | POA: Insufficient documentation

## 2021-11-07 DIAGNOSIS — K226 Gastro-esophageal laceration-hemorrhage syndrome: Principal | ICD-10-CM | POA: Insufficient documentation

## 2021-11-07 DIAGNOSIS — K3189 Other diseases of stomach and duodenum: Secondary | ICD-10-CM | POA: Insufficient documentation

## 2021-11-07 DIAGNOSIS — K922 Gastrointestinal hemorrhage, unspecified: Secondary | ICD-10-CM | POA: Diagnosis present

## 2021-11-07 DIAGNOSIS — Z79899 Other long term (current) drug therapy: Secondary | ICD-10-CM | POA: Insufficient documentation

## 2021-11-07 DIAGNOSIS — K2210 Ulcer of esophagus without bleeding: Secondary | ICD-10-CM | POA: Insufficient documentation

## 2021-11-07 DIAGNOSIS — E876 Hypokalemia: Secondary | ICD-10-CM | POA: Diagnosis present

## 2021-11-07 DIAGNOSIS — R7989 Other specified abnormal findings of blood chemistry: Secondary | ICD-10-CM | POA: Diagnosis present

## 2021-11-07 DIAGNOSIS — K921 Melena: Secondary | ICD-10-CM | POA: Insufficient documentation

## 2021-11-07 LAB — COMPREHENSIVE METABOLIC PANEL
ALT: 32 U/L (ref 0–44)
AST: 25 U/L (ref 15–41)
Albumin: 4.2 g/dL (ref 3.5–5.0)
Alkaline Phosphatase: 61 U/L (ref 38–126)
Anion gap: 9 (ref 5–15)
BUN: 15 mg/dL (ref 6–20)
CO2: 28 mmol/L (ref 22–32)
Calcium: 9.9 mg/dL (ref 8.9–10.3)
Chloride: 106 mmol/L (ref 98–111)
Creatinine, Ser: 1.4 mg/dL — ABNORMAL HIGH (ref 0.61–1.24)
GFR, Estimated: >60 mL/min (ref >60)
Glucose, Bld: 166 mg/dL — ABNORMAL HIGH (ref 70–99)
Potassium: 3.2 mmol/L — ABNORMAL LOW (ref 3.5–5.1)
Sodium: 143 mmol/L (ref 135–145)
Total Bilirubin: 0.5 mg/dL (ref 0.3–1.2)
Total Protein: 7.1 g/dL (ref 6.5–8.1)

## 2021-11-07 LAB — CBC
HCT: 36 % — ABNORMAL LOW (ref 39.0–52.0)
Hemoglobin: 11.9 g/dL — ABNORMAL LOW (ref 13.0–17.0)
MCH: 26.6 pg (ref 26.0–34.0)
MCHC: 33.1 g/dL (ref 30.0–36.0)
MCV: 80.5 fL (ref 80.0–100.0)
Platelets: 157 K/uL (ref 150–400)
RBC: 4.47 MIL/uL (ref 4.22–5.81)
RDW: 14.5 % (ref 11.5–15.5)
WBC: 6.8 K/uL (ref 4.0–10.5)
nRBC: 0 % (ref 0.0–0.2)

## 2021-11-07 MED ORDER — PANTOPRAZOLE SODIUM 40 MG IV SOLR
INTRAVENOUS | Status: AC
  Administered 2021-11-07: 4 mg
  Filled 2021-11-07: qty 10

## 2021-11-07 MED ORDER — PANTOPRAZOLE INFUSION (NEW) - SIMPLE MED
8.0000 mg/h | INTRAVENOUS | Status: DC
  Administered 2021-11-07 – 2021-11-09 (×4): 8 mg/h via INTRAVENOUS
  Filled 2021-11-07 (×2): qty 100
  Filled 2021-11-07 (×2): qty 80
  Filled 2021-11-07: qty 100

## 2021-11-07 MED ORDER — ACETAMINOPHEN 325 MG PO TABS
650.0000 mg | ORAL_TABLET | ORAL | Status: DC | PRN
  Administered 2021-11-07 – 2021-11-08 (×2): 650 mg via ORAL
  Filled 2021-11-07 (×2): qty 2

## 2021-11-07 NOTE — ED Provider Notes (Signed)
Accepted handoff at shift change from [facility]. Please see prior provider note for more detail.  ? ?Briefly: Patient is 36 y.o. with melanotic stools for 2 days that have been persistent.  Last stool was right before arrival in our ED.  He was recently seen by [facility] and was exposed to getting transferred to another hospital for GI bleed but he left AMA.  He ended up coming here instead.  It looks like he has a slight hemoglobin drop from 12.3 to 11.9 since this morning.  He has been using daily Goody's powder. ? ?DDX: concern for GI bleed 2/2 to NSAID use ? ?Plan: pending call back admission ? ? ?Physical Exam  ?BP (!) 145/93   Pulse 69   Temp 98.2 ?F (36.8 ?C) (Oral)   Resp 16   Ht 6\' 1"  (1.854 m)   Wt 83.9 kg   SpO2 100%   BMI 24.40 kg/m?  ? ?Physical Exam ? ?Procedures  ?Procedures ? ?ED Course / MDM  ? ?Clinical Course as of 11/07/21 2347  ?Tue Nov 07, 2021  ?1916 Creatinine stable. Hypokalemia present.  [GL]  ?1929 J. 1917 to accept [GL]  ?  ?Clinical Course User Index ?[GL] Tyaira Heward, Julian Reil, PA-C  ? ?Medical Decision Making ?Amount and/or Complexity of Data Reviewed ?Labs: ordered. ? ?Risk ?OTC drugs. ?Decision regarding hospitalization. ? ? ?Dr. Finis Bud is accepting physician. ? ? ? ? ?  ?Julian Reil, PA-C ?11/07/21 2347 ? ?  ?2348, MD ?11/11/21 [phone] ? ?

## 2021-11-07 NOTE — ED Triage Notes (Signed)
Patient here POV from Home. ? ?Endorses Black Stool yesterday AM noting Black Stool. Also noted 1 Episode of Emesis that AM.  ? ?Was at another ED but LWBS due to Wait. No Nausea. No New Vomiting. No Diarrhea. No Pain. No Fevers. No Anticoagulants. ? ?NAD Noted during Triage. A&Ox4. GCS 15. Ambulatory ?

## 2021-11-07 NOTE — ED Provider Notes (Signed)
?[facility] EMERGENCY DEPT ?Provider Note ? ? ?CSN: [id number] ?Arrival date & time: 11/07/21  1712 ? ?  ? ?History ? ?Chief Complaint  ?Patient presents with  ? Melena  ? ? ?Ricky Atkins is a 36 y.o. male. ? ?Patient with history of chronic headaches, daily NSAID use presents to the emergency department today for evaluation of melena and hematemesis.  Symptoms started yesterday morning.  He has had multiple black bowel movements since that time.  He denies pain other than a headache.  Patient was seen at [facility] and admitted for acute GI bleed.  Patient was being treated with Protonix drip.  He also had an NG tube placed.  He states that he sat in the emergency department all morning and into the afternoon and no one updated him on what was going on so he decided to leave.  He denies alcohol use or drug use.  He takes at least 1 Goody powder daily for headaches.  No history of blood clots or easy bruising or bleeding.  No headedness, syncope, fatigue.  He has never had GI bleeding before.  Denies EtOH use, h/o PUD.  Last episode of black stool just prior to arrival here. ? ?Labs from outside facility demonstrated: Hemoglobin 12.3, BUN 21, creatinine 1.44, potassium 3.0, lipase 80.  100 cc bloody aspirate out of NG tube there.  ? ? ?  ? ?Home Medications ?Prior to Admission medications   ?Medication Sig Start Date End Date Taking? Authorizing Provider  ?amoxicillin-clavulanate (AUGMENTIN) 875-125 MG tablet Take 1 tablet by mouth 2 (two) times daily.    [provider]  ?clindamycin (CLEOCIN) 300 MG capsule Take 1 capsule (300 mg total) by mouth 4 (four) times daily. X 7 days 05/29/18   Drenda Freeze, MD  ?docusate sodium (COLACE) 100 MG capsule Take 1 capsule (100 mg total) by mouth every 12 (twelve) hours. ?Patient not taking: Reported on 05/29/2018 09/30/15   Dorie Rank, MD  ?FLUoxetine (PROZAC) 10 MG tablet Take 10 mg by mouth daily.    [provider]  ?ondansetron  (ZOFRAN ODT) 4 MG disintegrating tablet Take 1 tablet (4 mg total) by mouth every 8 (eight) hours as needed for nausea or vomiting. ?Patient not taking: Reported on 05/29/2018 09/11/15   Leo Grosser, MD  ?pramoxine (PROCTOFOAM) 1 % foam Place 1 application rectally 3 (three) times daily as needed for itching. ?Patient not taking: Reported on 05/29/2018 09/30/15   Dorie Rank, MD  ?   ? ?Allergies    ?Patient has no known allergies.   ? ?Review of Systems   ?Review of Systems ? ?Physical Exam ?Updated Vital Signs ?BP (!) 168/90 (BP Location: Right Arm)   Pulse 66   Temp 98.2 ?F (36.8 ?C) (Oral)   Resp 16   Ht 6\' 1"  (1.854 m)   Wt 83.9 kg   SpO2 99%   BMI 24.40 kg/m?  ? ?Physical Exam ?Vitals and nursing note reviewed.  ?Constitutional:   ?   General: He is not in acute distress. ?   Appearance: He is well-developed.  ?HENT:  ?   Head: Normocephalic and atraumatic.  ?Eyes:  ?   General:     ?   Right eye: No discharge.     ?   Left eye: No discharge.  ?   Conjunctiva/sclera: Conjunctivae normal.  ?Cardiovascular:  ?   Rate and Rhythm: Normal rate and regular rhythm.  ?   Heart sounds: Normal heart sounds.  ?Pulmonary:  ?  Effort: Pulmonary effort is normal.  ?   Breath sounds: Normal breath sounds.  ?Abdominal:  ?   Palpations: Abdomen is soft.  ?   Tenderness: There is no abdominal tenderness. There is no guarding or rebound.  ?Musculoskeletal:  ?   Cervical back: Normal range of motion and neck supple.  ?Skin: ?   General: Skin is warm and dry.  ?Neurological:  ?   Mental Status: He is alert.  ? ? ?ED Results / Procedures / Treatments   ?Labs ?(all labs ordered are listed, but only abnormal results are displayed) ?Labs Reviewed  ?CBC - Abnormal; Notable for the following components:  ?    Result Value  ? Hemoglobin 11.9 (*)   ? HCT 36.0 (*)   ? All other components within normal limits  ?COMPREHENSIVE METABOLIC PANEL  ? ? ?EKG ?None ? ?Radiology ?No results found. ? ?Procedures ?Procedures  ? ? ?Medications  Ordered in ED ?Medications  ?pantoprozole (PROTONIX) 80 mg /NS 100 mL infusion (8 mg/hr Intravenous New Bag/Given 11/07/21 1834)  ?pantoprazole (PROTONIX) 40 MG injection (4 mg  Given 11/07/21 1834)  ?pantoprazole (PROTONIX) 40 MG injection (4 mg  Given 11/07/21 1834)  ? ? ?ED Course/ Medical Decision Making/ A&P ?Clinical Course as of 11/08/21 0902  ?Tue Nov 07, 2021  ?1916 Creatinine stable. Hypokalemia present.  [GL]  ?1929 J. [facility] to accept [GL]  ?  ?Clinical Course User Index ?[GL] Loeffler, Adora Fridge, PA-C  ? ? ?Patient seen and examined. History obtained directly from patient, bedside, as well as external hospital records. ? ?Labs/EKG: Ordered CBC, CMP. ? ?Imaging: None ordered. ? ?Medications/Fluids: Ordered: Protonix.  ? ?Most recent vital signs reviewed and are as follows: ?BP (!) 168/90 (BP Location: Right Arm)   Pulse 66   Temp 98.2 ?F (36.8 ?C) (Oral)   Resp 16   Ht 6\' 1"  (1.854 m)   Wt 83.9 kg   SpO2 99%   BMI 24.40 kg/m?  ? ?Initial impression: UGIB, likely 2/2 heavy NSAID use.  ? ?6:46 PM Hgb down slightly from this AM to 11.9. Discussed with patient and wife -- agreeable to admission. On protonix drip.  ? ?Orthostatic VS for the past 24 hrs: ? BP- Lying Pulse- Lying BP- Sitting Pulse- Sitting BP- Standing at 0 minutes Pulse- Standing at 0 minutes  ?11/07/21 1803 (!) 150/91 66 (!) 162/102 72 (!) 149/101 86  ? ?7:05 PM Signout to [employer] who will take consult for admission.  ? ?                        ?Medical Decision Making ?Amount and/or Complexity of Data Reviewed ?Labs: ordered. ? ?Risk ?OTC drugs. ?Decision regarding hospitalization. ? ? ?Admit.  ? ? ? ? ? ? ? ?Final Clinical Impression(s) / ED Diagnoses ?Final diagnoses:  ?Acute upper GI bleeding  ? ? ?Rx / DC Orders ?ED Discharge Orders   ? ? None  ? ?  ? ? ?  ?Carlisle Cater, PA-C ?11/08/21 [id number] ? ?  ?Fredia Sorrow, MD ?11/11/21 [phone] ? ?

## 2021-11-08 ENCOUNTER — Encounter (HOSPITAL_COMMUNITY): Payer: Self-pay | Admitting: Internal Medicine

## 2021-11-08 DIAGNOSIS — R7989 Other specified abnormal findings of blood chemistry: Secondary | ICD-10-CM | POA: Diagnosis present

## 2021-11-08 DIAGNOSIS — K922 Gastrointestinal hemorrhage, unspecified: Secondary | ICD-10-CM

## 2021-11-08 DIAGNOSIS — E876 Hypokalemia: Secondary | ICD-10-CM | POA: Diagnosis present

## 2021-11-08 DIAGNOSIS — D62 Acute posthemorrhagic anemia: Secondary | ICD-10-CM | POA: Diagnosis present

## 2021-11-08 DIAGNOSIS — R739 Hyperglycemia, unspecified: Secondary | ICD-10-CM | POA: Diagnosis present

## 2021-11-08 LAB — COMPREHENSIVE METABOLIC PANEL
ALT: 34 U/L (ref 0–44)
AST: 27 U/L (ref 15–41)
Albumin: 3.7 g/dL (ref 3.5–5.0)
Alkaline Phosphatase: 59 U/L (ref 38–126)
Anion gap: 4 — ABNORMAL LOW (ref 5–15)
BUN: 15 mg/dL (ref 6–20)
CO2: 28 mmol/L (ref 22–32)
Calcium: 8.9 mg/dL (ref 8.9–10.3)
Chloride: 110 mmol/L (ref 98–111)
Creatinine, Ser: 1.21 mg/dL (ref 0.61–1.24)
GFR, Estimated: >60 mL/min (ref >60)
Glucose, Bld: 109 mg/dL — ABNORMAL HIGH (ref 70–99)
Potassium: 3.2 mmol/L — ABNORMAL LOW (ref 3.5–5.1)
Sodium: 142 mmol/L (ref 135–145)
Total Bilirubin: 0.7 mg/dL (ref 0.3–1.2)
Total Protein: 6.8 g/dL (ref 6.5–8.1)

## 2021-11-08 LAB — TYPE AND SCREEN
ABO/RH(D): O POS
Antibody Screen: NEGATIVE

## 2021-11-08 LAB — PHOSPHORUS: Phosphorus: 2.8 mg/dL (ref 2.5–4.6)

## 2021-11-08 LAB — MAGNESIUM: Magnesium: 1.9 mg/dL (ref 1.7–2.4)

## 2021-11-08 MED ORDER — PANTOPRAZOLE SODIUM 40 MG IV SOLR
INTRAVENOUS | Status: AC
  Filled 2021-11-08: qty 20

## 2021-11-08 MED ORDER — ONDANSETRON HCL 4 MG PO TABS: 4.0000 mg | ORAL_TABLET | Freq: Four times a day (QID) | ORAL | Status: DC | PRN

## 2021-11-08 MED ORDER — POTASSIUM CHLORIDE IN NACL 20-0.9 MEQ/L-% IV SOLN
INTRAVENOUS | Status: DC
  Filled 2021-11-08 (×3): qty 1000

## 2021-11-08 MED ORDER — PANTOPRAZOLE SODIUM 40 MG IV SOLR: 40.0000 mg | Freq: Two times a day (BID) | INTRAVENOUS | Status: DC

## 2021-11-08 MED ORDER — ONDANSETRON HCL 4 MG/2ML IJ SOLN: 4.0000 mg | Freq: Four times a day (QID) | INTRAMUSCULAR | Status: DC | PRN

## 2021-11-08 NOTE — ED Notes (Signed)
Water and lime shasta provided. ?

## 2021-11-08 NOTE — ED Notes (Signed)
Called 5E Attempted report as bed has been assigned for 1 hour. RN unable to take report via telephone. Informed handoff report available. RN to call back if any questions.  ?

## 2021-11-08 NOTE — H&P (Signed)
?History and Physical  ? ? ?Patient: Ricky Atkins GYI:[id number] DOB: 04-21-1986 ?DOA: 11/07/2021 ?DOS: the patient was seen and examined on 11/08/2021 ?PCP: Patient, No Pcp Per (Inactive)  ?Patient coming from: Home ? ?Chief Complaint:  ?Chief Complaint  ?Patient presents with  ? Melena  ? ?HPI: Ricky Atkins is a 36 y.o. male with medical history significant of bipolar 1 disorder, polysubstance abuse, opioid overdose, depression on fluoxetine 20 mg p.o. daily after presenting there with 3 episodes of melena and 1 episode of hematemesis on Monday likely due to overuse of Goody powder for headache.  He was seen at [facility] on Monday, was started on pantoprazole continuous infusion, had an NGT placed, but left AMA.  No flank pain, dysuria, frequency or hematuria.  He denied drinking alcohol or coffee use.  He eat spicy foods on occasion.  He drinks Monster energy drinks.  He has no previous history of PUD, but stated that his father and grandfather did.  No nausea or abdominal pain at this time.  He denied fever, chills, sore throat, rhinorrhea, dyspnea, wheezing or hemoptysis.  No chest pain, palpitations, diaphoresis, PND, orthopnea or pitting edema of the lower extremities.  No polyuria, polydipsia, polyphagia or blurred vision. ? ?ED course: Initial vital signs were temperature 98.2 ?F, pulse 62, respirations 16, BP 168/90 mmHg O2 sat 99% on room air.  The patient was restarted on pantoprazole infusion in the emergency department. ? ?Lab work: CBC showed a white count 6.8, hemoglobin 11.9 g/dL platelets 350.  CMP with a potassium of 3.2 mmol/L, glucose of 166 and creatinine 1.40 mg/dL.  The rest of the CMP values were normal. ?  ?Review of Systems: As mentioned in the history of present illness. All other systems reviewed and are negative. ?Past Medical History:  ?Diagnosis Date  ? Bipolar 1 disorder (HCC)   ? ?History reviewed. No pertinent surgical history. ?Social History:  reports that he has been  smoking cigarettes. He has been smoking an average of 1 pack per day. He does not have any smokeless tobacco history on file. He reports that he does not drink alcohol and does not use drugs. ? ?No Known Allergies ? ?Family History  ?Problem Relation Age of Onset  ? Peptic Ulcer Disease Father   ? Peptic Ulcer Disease Paternal Grandfather   ? Alcohol abuse Paternal Grandfather   ? ?Prior to Admission medications   ?Medication Sig Start Date End Date Taking? Authorizing Provider  ?amoxicillin-clavulanate (AUGMENTIN) 875-125 MG tablet Take 1 tablet by mouth 2 (two) times daily.    [provider]  ?clindamycin (CLEOCIN) 300 MG capsule Take 1 capsule (300 mg total) by mouth 4 (four) times daily. X 7 days 05/29/18   Charlynne Pander, MD  ?docusate sodium (COLACE) 100 MG capsule Take 1 capsule (100 mg total) by mouth every 12 (twelve) hours. ?Patient not taking: Reported on 05/29/2018 09/30/15   Linwood Dibbles, MD  ?FLUoxetine (PROZAC) 10 MG tablet Take 10 mg by mouth daily.    [provider]  ?ondansetron (ZOFRAN ODT) 4 MG disintegrating tablet Take 1 tablet (4 mg total) by mouth every 8 (eight) hours as needed for nausea or vomiting. ?Patient not taking: Reported on 05/29/2018 09/11/15   Lyndal Pulley, MD  ?pramoxine (PROCTOFOAM) 1 % foam Place 1 application rectally 3 (three) times daily as needed for itching. ?Patient not taking: Reported on 05/29/2018 09/30/15   Linwood Dibbles, MD  ? ? ?Physical Exam: ?Vitals:  ? 11/08/21 0600 11/08/21 0900  11/08/21 1200 11/08/21 1243  ?BP: 120/82 133/81 (!) 135/93 (!) 153/97  ?Pulse: 60 62 72 65  ?Resp: 19 17 (!) 23 15  ?Temp:    98.2 ?F (36.8 ?C)  ?TempSrc:    Oral  ?SpO2: 98% 95% 99% 98%  ?Weight:      ?Height:      ? ?Physical Exam ?Vitals and nursing note reviewed.  ?HENT:  ?   Head: Normocephalic.  ?   Mouth/Throat:  ?   Mouth: Mucous membranes are moist.  ?Eyes:  ?   General: No scleral icterus. ?Neck:  ?   Vascular: No JVD.  ?Cardiovascular:  ?   Rate and Rhythm:  Normal rate and regular rhythm.  ?   Heart sounds: S1 normal and S2 normal.  ?Pulmonary:  ?   Effort: Pulmonary effort is normal.  ?   Breath sounds: Normal breath sounds.  ?Abdominal:  ?   General: Bowel sounds are normal. There is no distension.  ?   Palpations: Abdomen is soft.  ?   Tenderness: There is no abdominal tenderness.  ?Musculoskeletal:  ?   Cervical back: Neck supple.  ?   Right lower leg: No edema.  ?   Left lower leg: No edema.  ?Skin: ?   General: Skin is warm.  ?   Coloration: Skin is not jaundiced.  ?Neurological:  ?   General: No focal deficit present.  ?   Mental Status: He is alert and oriented to person, place, and time.  ?Psychiatric:     ?   Mood and Affect: Mood normal.  ? ? ?Data Reviewed: ? ?There are no new results to review at this time. ? ?Assessment and Plan: ?Principal Problem: ?  Acute blood loss anemia ?In the setting of ?  UGIB (upper gastrointestinal bleed) ?Observation/telemetry. ?IV hydration. ?Antiemetics as needed. ?Continue pantoprazole infusion. ?Check type and screen. ?Check hematocrit and hemoglobin. ?Transfuse PRBC as needed. ?Consult gastroenterology. ? ?Active Problems: ?  Hyperglycemia ?Recheck glucose. ?Recheck fasting glucose. ? ?  Hypokalemia ?Replacement ordered. ?Check magnesium level. ?Follow potassium level. ? ?  Elevated serum creatinine ?GFR is over 60 mL/min. ?Follow-up creatinine level. ? ? ? ? Advance Care Planning:   Code Status: Full Code  ? ?Consults: [facility] ( Dr. Burnard Bunting). ? ?Family Communication:  ? ?Severity of Illness: ?The appropriate patient status for this patient is OBSERVATION. Observation status is judged to be reasonable and necessary in order to provide the required intensity of service to ensure the patient's safety. The patient's presenting symptoms, physical exam findings, and initial radiographic and laboratory data in the context of their medical condition is felt to place them at decreased risk for further clinical deterioration.  Furthermore, it is anticipated that the patient will be medically stable for discharge from the hospital within 2 midnights of admission.  ? ?Author: ?Bobette Mo, MD ?11/08/2021 1:06 PM ? ?For on call review [email or web address].  ? ?This document was prepared using Dragon voice recognition software and may contain some unintended transcription errors. ?

## 2021-11-08 NOTE — ED Notes (Signed)
ED TO INPATIENT HANDOFF REPORT ? ?ED Nurse Name and Phone #: Fredric Mare  ? ?S ?Name/Age/Gender ?Ricky Atkins ?36 y.o. ?male ?Room/Bed: WA17/WA17 ? ?Code Status ?  Code Status: Full Code ? ?Home/SNF/Other ?Home ?Patient oriented to: self, place, time, and situation ?Is this baseline? Yes  ? ?Triage Complete: Triage complete  ?Chief Complaint ?UGIB (upper gastrointestinal bleed) [K92.2] ? ?Triage Note ?Patient here POV from Home. ? ?Endorses Black Stool yesterday AM noting Black Stool. Also noted 1 Episode of Emesis that AM.  ? ?Was at another ED but LWBS due to Wait. No Nausea. No New Vomiting. No Diarrhea. No Pain. No Fevers. No Anticoagulants. ? ?NAD Noted during Triage. A&Ox4. GCS 15. Ambulatory  ? ?Allergies ?No Known Allergies ? ?Level of Care/Admitting Diagnosis ?ED Disposition   ? ? ED Disposition  ?Admit  ? Condition  ?--  ? Comment  ?Hospital Area: [facility] [[id number]] ? Level of Care: Telemetry [5] ? Admit to tele based on following criteria: Complex arrhythmia (Bradycardia/Tachycardia) ? Interfacility transfer: Yes ? May place patient in observation at [facility] or [facility] if equivalent level of care is available:: Yes ? Covid Evaluation: Asymptomatic - no recent exposure (last 10 days) testing not required ? Diagnosis: UGIB (upper gastrointestinal bleed) [564332] ? Admitting Physician: Hillary Bow [[id number]] ? Attending Physician: Hillary Bow [phone] ?  ?  ? ?  ? ? ?B ?Medical/Surgery History ?Past Medical History:  ?Diagnosis Date  ? Bipolar 1 disorder (HCC)   ? ?History reviewed. No pertinent surgical history.  ? ?A ?IV Location/Drains/Wounds ?Patient Lines/Drains/Airways Status   ? ? Active Line/Drains/Airways   ? ? Name Placement date Placement time Site Days  ? Peripheral IV 11/07/21 20 G 1" Anterior;Distal;Right;Upper Arm 11/07/21  1817  Arm  1  ? ?  ?  ? ?  ? ? ?Intake/Output Last 24 hours ? ?Intake/Output Summary (Last 24 hours) at 11/08/2021 1948 ?Last data filed at  11/08/2021 0908 ?Gross per 24 hour  ?Intake 145.65 ml  ?Output --  ?Net 145.65 ml  ? ? ?Labs/Imaging ?Results for orders placed or performed during the hospital encounter of 11/07/21 (from the past 48 hour(s))  ?Comprehensive metabolic panel     Status: Abnormal  ? Collection Time: 11/07/21  5:52 PM  ?Result Value Ref Range  ? Sodium 143 135 - 145 mmol/L  ? Potassium 3.2 (L) 3.5 - 5.1 mmol/L  ? Chloride 106 98 - 111 mmol/L  ? CO2 28 22 - 32 mmol/L  ? Glucose, Bld 166 (H) 70 - 99 mg/dL  ?  Comment: Glucose reference range applies only to samples taken after fasting for at least 8 hours.  ? BUN 15 6 - 20 mg/dL  ? Creatinine, Ser 1.40 (H) 0.61 - 1.24 mg/dL  ? Calcium 9.9 8.9 - 10.3 mg/dL  ? Total Protein 7.1 6.5 - 8.1 g/dL  ? Albumin 4.2 3.5 - 5.0 g/dL  ? AST 25 15 - 41 U/L  ? ALT 32 0 - 44 U/L  ? Alkaline Phosphatase 61 38 - 126 U/L  ? Total Bilirubin 0.5 0.3 - 1.2 mg/dL  ? GFR, Estimated >60 >60 mL/min  ?  Comment: (NOTE) ?Calculated using the CKD-EPI Creatinine Equation (2021) ?  ? Anion gap 9 5 - 15  ?  Comment: Performed at [facility], [address], [place], [place] [address]  ?CBC     Status: Abnormal  ? Collection Time: 11/07/21  5:52 PM  ?Result Value  Ref Range  ? WBC 6.8 4.0 - 10.5 K/uL  ? RBC 4.47 4.22 - 5.81 MIL/uL  ? Hemoglobin 11.9 (L) 13.0 - 17.0 g/dL  ? HCT 36.0 (L) 39.0 - 52.0 %  ? MCV 80.5 80.0 - 100.0 fL  ? MCH 26.6 26.0 - 34.0 pg  ? MCHC 33.1 30.0 - 36.0 g/dL  ? RDW 14.5 11.5 - 15.5 %  ? Platelets 157 150 - 400 K/uL  ? nRBC 0.0 0.0 - 0.2 %  ?  Comment: Performed at [facility], [address], [place], [place] [address]  ?Comprehensive metabolic panel     Status: Abnormal  ? Collection Time: 11/08/21  1:02 PM  ?Result Value Ref Range  ? Sodium 142 135 - 145 mmol/L  ? Potassium 3.2 (L) 3.5 - 5.1 mmol/L  ? Chloride 110 98 - 111 mmol/L  ? CO2 28 22 - 32 mmol/L  ? Glucose, Bld 109 (H) 70 - 99 mg/dL  ?  Comment: Glucose reference range applies only to samples  taken after fasting for at least 8 hours.  ? BUN 15 6 - 20 mg/dL  ? Creatinine, Ser 1.21 0.61 - 1.24 mg/dL  ? Calcium 8.9 8.9 - 10.3 mg/dL  ? Total Protein 6.8 6.5 - 8.1 g/dL  ? Albumin 3.7 3.5 - 5.0 g/dL  ? AST 27 15 - 41 U/L  ? ALT 34 0 - 44 U/L  ? Alkaline Phosphatase 59 38 - 126 U/L  ? Total Bilirubin 0.7 0.3 - 1.2 mg/dL  ? GFR, Estimated >60 >60 mL/min  ?  Comment: (NOTE) ?Calculated using the CKD-EPI Creatinine Equation (2021) ?  ? Anion gap 4 (L) 5 - 15  ?  Comment: Performed at [facility], [address] [place], [place] [address]  ?Magnesium     Status: None  ? Collection Time: 11/08/21  1:02 PM  ?Result Value Ref Range  ? Magnesium 1.9 1.7 - 2.4 mg/dL  ?  Comment: Performed at [facility], [address] [place], [place] [address]  ?Phosphorus     Status: None  ? Collection Time: 11/08/21  1:02 PM  ?Result Value Ref Range  ? Phosphorus 2.8 2.5 - 4.6 mg/dL  ?  Comment: Performed at [facility], [address], [place], [place] [address]  ?Type and screen [facility]     Status: None  ? Collection Time: 11/08/21  1:55 PM  ?Result Value Ref Range  ? ABO/RH(D) O POS   ? Antibody Screen NEG   ? Sample Expiration    ?  11/11/2021,2359 ?Performed at [facility], [address] [place], [place] [address] ?  ? ?No results found. ? ?Pending Labs ?Unresulted Labs (From admission, onward)  ? ?  Start     Ordered  ? 11/09/21 0500  HIV Antibody (routine testing w rflx)  (HIV Antibody (Routine testing w reflex) panel)  Tomorrow morning,   R       ? 11/08/21 1303  ? 11/09/21 0500  CBC  Tomorrow morning,   R       ? 11/08/21 1303  ? 11/09/21 0500  Comprehensive metabolic panel  Tomorrow morning,   R       ? 11/08/21 1303  ? 11/08/21 0320  ABO/Rh  Once,   R       ? 11/08/21 0320  ? ?  ?  ? ?  ? ? ?Vitals/Pain ?Today's Vitals  ?  11/08/21 1900 11/08/21 1929 11/08/21 1929 11/08/21 1945  ?BP: (!) 156/97   137/89  ?Pulse: 65    68  ?Resp: (!) 21   10  ?Temp:    98.2 ?F (36.8 ?C)  ?TempSrc:    Oral  ?SpO2: 99%   100%  ?Weight:      ?Height:      ?PainSc:  0-No pain 0-No pain 0-No pain  ? ? ?Isolation Precautions ?No active isolations ? ?Medications ?Medications  ?pantoprozole (PROTONIX) 80 mg /NS 100 mL infusion (8 mg/hr Intravenous New Bag/Given 11/08/21 1851)  ?acetaminophen (TYLENOL) tablet 650 mg (650 mg Oral Given 11/07/21 1911)  ?pantoprazole (PROTONIX) 40 MG injection (  Not Given 11/08/21 0508)  ?ondansetron (ZOFRAN) tablet 4 mg (has no administration in time range)  ?  Or  ?ondansetron (ZOFRAN) injection 4 mg (has no administration in time range)  ?0.9 % NaCl with KCl 20 mEq/ L  infusion ( Intravenous New Bag/Given 11/08/21 1423)  ?pantoprazole (PROTONIX) injection 40 mg (has no administration in time range)  ?pantoprazole (PROTONIX) 40 MG injection (4 mg  Given 11/07/21 1834)  ?pantoprazole (PROTONIX) 40 MG injection (4 mg  Given 11/07/21 1834)  ? ? ?Mobility ?walks ?Low fall risk  ? ?Focused Assessments ?- ? ? ?R ?Recommendations: See Admitting Provider Note ? ?Report given to:  ? ?Additional Notes: - No longer vomiting, infusing IVF NaCl with KCL at 13800ml/hr and Protonix 10 ml/hr  ? ?

## 2021-11-08 NOTE — ED Notes (Signed)
Patient provided with clear liquid. Denies any N/V/D.  ?

## 2021-11-08 NOTE — ED Notes (Signed)
RN called patient placement requesting update regarding bed assignment. Pt had lost bed during transfer from [place] to [facility].  ?

## 2021-11-08 NOTE — Consult Note (Signed)
Referring Provider: [facility] ?Primary Care Physician:  Patient, No Pcp Per (Inactive) ?Primary Gastroenterologist: Althia Forts ? ?Reason for Consultation:  melena, hematochezia ? ?HPI: Ricky Atkins is a 36 y.o. male medical history significant of bipolar 1 disorder, polysubstance abuse, opioid overdose, depression on fluoxetine 20 mg p.o. daily after presenting there with 3 episodes of melena since Sunday and 1 episode of hematemesis on Monday.  ? ?He notes on Sunday he began to have bad heartburn symptoms.  He had 1 episode of vomiting that was black, no bright red blood.  Starting early Monday morning he had a bowel movement that was black and sticky.  He has since had 2 more bowel movements of similar consistency.  Denies bowel movements today.  No further emesis episodes. ? ?Patient reports daily use of 1-2 Goody powder for headaches.  Notes initially he was using them to relieve headaches now they have become more of a habit. ? ?He was seen at [facility] on 4/17, was started on pantoprazole IV, had an NGT placed, but left AMA.   ? ?Notes family history of maternal grandfather with colon cancer, unknown age but likely before age 61. ? ?He denied drinking alcohol.  Smokes half pack cigarettes daily. ?He has no previous history of PUD, but stated that his father and grandfather did.  ? ?No nausea, emesis, hematochezia or abdominal pain at this time.   ? ? ?Past Medical History:  ?Diagnosis Date  ? Bipolar 1 disorder ([place])   ? ? ?History reviewed. No pertinent surgical history. ? ?Prior to Admission medications   ?Medication Sig Start Date End Date Taking? Authorizing Provider  ?amoxicillin-clavulanate (AUGMENTIN) 875-125 MG tablet Take 1 tablet by mouth 2 (two) times daily.    [provider]  ?clindamycin (CLEOCIN) 300 MG capsule Take 1 capsule (300 mg total) by mouth 4 (four) times daily. X 7 days 05/29/18   Drenda Freeze, MD  ?docusate sodium (COLACE) 100 MG capsule Take 1 capsule (100 mg total)  by mouth every 12 (twelve) hours. ?Patient not taking: Reported on 05/29/2018 09/30/15   Dorie Rank, MD  ?FLUoxetine (PROZAC) 10 MG tablet Take 10 mg by mouth daily.    [provider]  ?ondansetron (ZOFRAN ODT) 4 MG disintegrating tablet Take 1 tablet (4 mg total) by mouth every 8 (eight) hours as needed for nausea or vomiting. ?Patient not taking: Reported on 05/29/2018 09/11/15   Leo Grosser, MD  ?pramoxine (PROCTOFOAM) 1 % foam Place 1 application rectally 3 (three) times daily as needed for itching. ?Patient not taking: Reported on 05/29/2018 09/30/15   Dorie Rank, MD  ? ? ?Scheduled Meds: ? pantoprazole      ? [START ON 11/10/2021] pantoprazole (PROTONIX) IV  40 mg Intravenous Q12H  ? ?Continuous Infusions: ? 0.9 % NaCl with KCl 20 mEq / L    ? pantoprazole 8 mg/hr (11/08/21 1330)  ? ?PRN Meds:.acetaminophen, ondansetron **OR** ondansetron (ZOFRAN) IV ? ?Allergies as of 11/07/2021  ? (No Known Allergies)  ? ? ?Family History  ?Problem Relation Age of Onset  ? Peptic Ulcer Disease Father   ? Peptic Ulcer Disease Paternal Grandfather   ? Alcohol abuse Paternal Grandfather   ? ? ?Social History  ? ?Socioeconomic History  ? Marital status: Single  ?  Spouse name: Not on file  ? Number of children: Not on file  ? Years of education: Not on file  ? Highest education level: Not on file  ?Occupational History  ? Not on file  ?  Tobacco Use  ? Smoking status: Every Day  ?  Packs/day: 1.00  ?  Types: Cigarettes  ? Smokeless tobacco: Not on file  ?Substance and Sexual Activity  ? Alcohol use: No  ? Drug use: No  ? Sexual activity: Not on file  ?Other Topics Concern  ? Not on file  ?Social History Narrative  ? Not on file  ? ?Social Determinants of Health  ? ?Financial Resource Strain: Not on file  ?Food Insecurity: Not on file  ?Transportation Needs: Not on file  ?Physical Activity: Not on file  ?Stress: Not on file  ?Social Connections: Not on file  ?Intimate Partner Violence: Not on file  ? ? ?Review of Systems:  Review of Systems  ?Constitutional:  Negative for chills and fever.  ?HENT:  Negative for hearing loss and tinnitus.   ?Eyes:  Negative for blurred vision and double vision.  ?Respiratory:  Negative for cough and hemoptysis.   ?Cardiovascular:  Negative for chest pain and palpitations.  ?Gastrointestinal:  Positive for melena. Negative for abdominal pain, blood in stool, constipation, diarrhea, heartburn, nausea and vomiting.  ?Genitourinary:  Negative for dysuria and urgency.  ?Musculoskeletal:  Negative for myalgias and neck pain.  ?Skin:  Negative for rash.  ?Neurological:  Negative for dizziness and headaches.  ?Endo/Heme/Allergies:  Negative for environmental allergies. Does not bruise/bleed easily.  ?Psychiatric/Behavioral:  Positive for substance abuse.   ? ? ?Physical Exam:Physical Exam ?Constitutional:   ?   General: He is not in acute distress. ?   Appearance: Normal appearance. He is normal weight. He is not toxic-appearing.  ?HENT:  ?   Head: Normocephalic and atraumatic.  ?   Right Ear: External ear normal.  ?   Left Ear: External ear normal.  ?   Nose: Nose normal.  ?   Mouth/Throat:  ?   Mouth: Mucous membranes are moist.  ?Eyes:  ?   Conjunctiva/sclera: Conjunctivae normal.  ?   Pupils: Pupils are equal, round, and reactive to light.  ?Cardiovascular:  ?   Rate and Rhythm: Normal rate and regular rhythm.  ?   Pulses: Normal pulses.  ?   Heart sounds: Normal heart sounds.  ?Pulmonary:  ?   Effort: Pulmonary effort is normal.  ?   Breath sounds: Normal breath sounds.  ?Abdominal:  ?   General: Abdomen is flat. Bowel sounds are normal. There is no distension.  ?   Palpations: Abdomen is soft. There is no mass.  ?   Tenderness: There is no abdominal tenderness. There is no guarding or rebound.  ?   Hernia: No hernia is present.  ?Musculoskeletal:     ?   General: Normal range of motion.  ?   Cervical back: Normal range of motion.  ?Skin: ?   General: Skin is warm and dry.  ?   Coloration: Skin is not  jaundiced or pale.  ?Neurological:  ?   General: No focal deficit present.  ?   Mental Status: He is alert and oriented to person, place, and time. Mental status is at baseline.  ?Psychiatric:     ?   Mood and Affect: Mood normal.     ?   Behavior: Behavior normal.  ? ? ?Vital signs: ?Vitals:  ? 11/08/21 1243 11/08/21 1330  ?BP: (!) 153/97 124/85  ?Pulse: 65 62  ?Resp: 15 18  ?Temp: 98.2 ?F (36.8 ?C)   ?SpO2: 98% 99%  ? ?  ? ? ? ?GI:  ?Lab Results: ?Recent  Labs  ?  11/07/21 ?1752  ?WBC 6.8  ?HGB 11.9*  ?HCT 36.0*  ?PLT 157  ? ?BMET ?Recent Labs  ?  11/07/21 ?1752  ?NA 143  ?K 3.2*  ?CL 106  ?CO2 28  ?GLUCOSE 166*  ?BUN 15  ?CREATININE 1.40*  ?CALCIUM 9.9  ? ?LFT ?Recent Labs  ?  11/07/21 ?1752  ?PROT 7.1  ?ALBUMIN 4.2  ?AST 25  ?ALT 32  ?ALKPHOS 61  ?BILITOT 0.5  ? ?PT/INR ?No results for input(s): LABPROT, INR in the last 72 hours. ? ? ?Studies/Results: ?No results found. ? ?Impression: ?Upper GI Bleed  ?Melena  ?Hematochezia  ? ?Hgb: 11.9, BUN 15, Cr 1.4,  ? ?3 episodes of melena, 1 episode hematochezia ?Likely upper GI bleed, possibly due to over use of Goody powders.  ? ?Plan: ?Plan for EGD tomorrow. I thoroughly discussed the procedures to include nature, alternatives, benefits, and risks including but not limited to bleeding, perforation, infection, anesthesia/cardiac and pulmonary complications. Patient provides understanding and gave verbal consent to proceed. ?Continue Protonix IV 40mg  BID ?Continue clear liquid diet ?NPO at midnight ?Continue anti-emetics and supportive care as needed. ?[facility] will follow.    ? LOS: 0 days  ? ?Charlott Rakes  PA-C ?11/08/2021, 1:49 PM ? ?Contact #  [phone]  ?

## 2021-11-09 ENCOUNTER — Observation Stay (HOSPITAL_COMMUNITY): Payer: Self-pay | Admitting: Anesthesiology

## 2021-11-09 ENCOUNTER — Other Ambulatory Visit (HOSPITAL_COMMUNITY): Payer: Self-pay

## 2021-11-09 ENCOUNTER — Encounter (HOSPITAL_COMMUNITY): Payer: Self-pay | Admitting: Internal Medicine

## 2021-11-09 ENCOUNTER — Encounter (HOSPITAL_COMMUNITY)
Admission: EM | Disposition: A | Discharge status: Home / Self Care | Payer: Self-pay | Source: Home / Self Care | Attending: Emergency Medicine

## 2021-11-09 ENCOUNTER — Observation Stay (HOSPITAL_BASED_OUTPATIENT_CLINIC_OR_DEPARTMENT_OTHER): Payer: Self-pay | Admitting: Anesthesiology

## 2021-11-09 DIAGNOSIS — D649 Anemia, unspecified: Secondary | ICD-10-CM

## 2021-11-09 DIAGNOSIS — K3189 Other diseases of stomach and duodenum: Secondary | ICD-10-CM

## 2021-11-09 DIAGNOSIS — R7989 Other specified abnormal findings of blood chemistry: Secondary | ICD-10-CM

## 2021-11-09 DIAGNOSIS — K2211 Ulcer of esophagus with bleeding: Secondary | ICD-10-CM

## 2021-11-09 DIAGNOSIS — K449 Diaphragmatic hernia without obstruction or gangrene: Secondary | ICD-10-CM

## 2021-11-09 HISTORY — PX: ESOPHAGOGASTRODUODENOSCOPY: SHX5428

## 2021-11-09 LAB — CBC
HCT: 33.9 % — ABNORMAL LOW (ref 39.0–52.0)
Hemoglobin: 11.2 g/dL — ABNORMAL LOW (ref 13.0–17.0)
MCH: 27.5 pg (ref 26.0–34.0)
MCHC: 33 g/dL (ref 30.0–36.0)
MCV: 83.1 fL (ref 80.0–100.0)
Platelets: 130 10*3/uL — ABNORMAL LOW (ref 150–400)
RBC: 4.08 MIL/uL — ABNORMAL LOW (ref 4.22–5.81)
RDW: 14.3 % (ref 11.5–15.5)
WBC: 5.3 10*3/uL (ref 4.0–10.5)
nRBC: 0 % (ref 0.0–0.2)

## 2021-11-09 LAB — COMPREHENSIVE METABOLIC PANEL
ALT: 32 U/L (ref 0–44)
AST: 26 U/L (ref 15–41)
Albumin: 3.6 g/dL (ref 3.5–5.0)
Alkaline Phosphatase: 54 U/L (ref 38–126)
Anion gap: 5 (ref 5–15)
BUN: 12 mg/dL (ref 6–20)
CO2: 27 mmol/L (ref 22–32)
Calcium: 8.4 mg/dL — ABNORMAL LOW (ref 8.9–10.3)
Chloride: 110 mmol/L (ref 98–111)
Creatinine, Ser: 1.17 mg/dL (ref 0.61–1.24)
GFR, Estimated: >60 mL/min (ref >60)
Glucose, Bld: 97 mg/dL (ref 70–99)
Potassium: 3.5 mmol/L (ref 3.5–5.1)
Sodium: 142 mmol/L (ref 135–145)
Total Bilirubin: 0.5 mg/dL (ref 0.3–1.2)
Total Protein: 6.7 g/dL (ref 6.5–8.1)

## 2021-11-09 LAB — HIV ANTIBODY (ROUTINE TESTING W REFLEX): HIV Screen 4th Generation wRfx: NONREACTIVE

## 2021-11-09 LAB — ABO/RH: ABO/RH(D): O POS

## 2021-11-09 SURGERY — EGD (ESOPHAGOGASTRODUODENOSCOPY)
Anesthesia: Monitor Anesthesia Care

## 2021-11-09 MED ORDER — PROPOFOL 10 MG/ML IV BOLUS
INTRAVENOUS | Status: DC | PRN
  Administered 2021-11-09 (×2): 50 mg via INTRAVENOUS

## 2021-11-09 MED ORDER — PROPOFOL 500 MG/50ML IV EMUL
INTRAVENOUS | Status: DC | PRN
  Administered 2021-11-09: 150 ug/kg/min via INTRAVENOUS

## 2021-11-09 MED ORDER — LIDOCAINE 2% (20 MG/ML) 5 ML SYRINGE
INTRAMUSCULAR | Status: DC | PRN
  Administered 2021-11-09: 80 mg via INTRAVENOUS

## 2021-11-09 MED ORDER — SODIUM CHLORIDE 0.9 % IV SOLN: INTRAVENOUS | Status: DC

## 2021-11-09 MED ORDER — LACTATED RINGERS IV SOLN: INTRAVENOUS | Status: DC | PRN

## 2021-11-09 MED ORDER — LACTATED RINGERS IV SOLN
INTRAVENOUS | Status: AC | PRN
  Administered 2021-11-09: 1000 mL via INTRAVENOUS

## 2021-11-09 MED ORDER — PANTOPRAZOLE SODIUM 40 MG PO TBEC
40.0000 mg | DELAYED_RELEASE_TABLET | Freq: Every day | ORAL | 0 refills | Status: AC
  Filled 2021-11-09: qty 30, 30d supply, fill #0

## 2021-11-09 NOTE — Discharge Summary (Signed)
?Physician Discharge Summary ?  ?Patient: Ricky Atkins MRN: [id number] DOB: [birth year]  ?Admit date:     11/07/2021  ?Discharge date: 11/09/21  ?Discharge Physician: Rickey Barbara  ? ?PCP: Patient, No Pcp Per (Inactive)  ? ?Recommendations at discharge:  ? ? Follow up with PCP in 1-2 weeks ? ?Discharge Diagnoses: ?Principal Problem: ?  UGIB (upper gastrointestinal bleed) ?Active Problems: ?  Hyperglycemia ?  Hypokalemia ?  Acute blood loss anemia ?  Elevated serum creatinine ? ?Resolved Problems: ?  * No resolved hospital problems. * ? ?Hospital Course: ?36 y.o. male with medical history significant of bipolar 1 disorder, polysubstance abuse, opioid overdose, depression on fluoxetine 20 mg p.o. daily after presenting there with 3 episodes of melena and 1 episode of hematemesis on Monday likely due to overuse of Goody powder for headache.  He was seen at [facility] on Monday, was started on pantoprazole continuous infusion, had an NGT placed, but left AMA. Pt presented to [facility] for further work up ? ?Assessment and Plan: ?No notes have been filed under this hospital service. ?Service: Hospitalist ? ?Principal Problem: ?  Acute blood loss anemia ?In the setting of ?  UGIB (upper gastrointestinal bleed) ?Pt was continued on pantoprazole infusion. ?GI consulted and pt underwent EGD 4/20 with findings of mallory weiss tear ?Recs for PPI x 1 month and to stop further Goody Powders ?  ?Active Problems: ?  Hyperglycemia ?Random glucose 97 ?  ?  Hypokalemia ?Potassium normalized ?  ?  Elevated serum creatinine ?GFR is over 60 mL/min. ?  ? ?  ? ? ?Consultants: GI ?Procedures performed: EGD  ?Disposition: Home ?Diet recommendation:  ?Regular diet ?DISCHARGE MEDICATION: ?Allergies as of 11/09/2021   ?No Known Allergies ?  ? ?  ?Medication List  ?  ? ?STOP taking these medications   ? ?BC HEADACHE POWDER PO ?  ? ?  ? ?TAKE these medications   ? ?FLUoxetine 20 MG capsule ?Commonly known as: PROZAC ?Take 20 mg by mouth every  morning. ?  ?pantoprazole 40 MG tablet ?Commonly known as: Protonix ?Take 1 tablet (40 mg total) by mouth daily. ?  ? ?  ? ? Follow-up Information   ? ? Follow up with PCP in 1-2 weeks Follow up.   ?Why: Hospital follow up ? ?  ?  ? ?  ?  ? ?  ? ?Discharge Exam: ?Filed Weights  ? 11/07/21 1725 11/09/21 1133  ?Weight: 83.9 kg 83.9 kg  ? ?General exam: Awake, laying in bed, in nad ?Respiratory system: Normal respiratory effort, no wheezing ?Cardiovascular system: regular rate, s1, s2 ?Gastrointestinal system: Soft, nondistended, positive BS ?Central nervous system: CN2-12 grossly intact, strength intact ?Extremities: Perfused, no clubbing ?Skin: Normal skin turgor, no notable skin lesions seen ?Psychiatry: Mood normal // no visual hallucinations  ? ?Condition at discharge: fair ? ?The results of significant diagnostics from this hospitalization (including imaging, microbiology, ancillary and laboratory) are listed below for reference.  ? ?Imaging Studies: ?No results found. ? ?Microbiology: ?Results for orders placed or performed during the hospital encounter of 05/29/18  ?Blood culture (routine x 2)     Status: None  ? Collection Time: 05/29/18  9:42 PM  ? Specimen: BLOOD  ?Result Value Ref Range Status  ? Specimen Description   Final  ?  BLOOD RIGHT ANTECUBITAL ?Performed at [facility], [address] [place], [place] [address] ?  ? Special Requests   Final  ?  BOTTLES DRAWN AEROBIC AND ANAEROBIC  Blood Culture adequate volume ?Performed at [facility], [address] [place], [place] [address] ?  ? Culture   Final  ?  NO GROWTH 5 DAYS ?Performed at [facility], [address] [place], [place] [address] ?  ? Report Status 06/04/2018 FINAL  Final  ? ? ?Labs: ?CBC: ?Recent Labs  ?Lab 11/07/21 ?1752 11/09/21 ?0507  ?WBC 6.8 5.3  ?HGB 11.9* 11.2*  ?HCT 36.0* 33.9*  ?MCV 80.5 83.1  ?PLT 157 130*  ? ?Basic Metabolic Panel: ?Recent Labs  ?Lab 11/07/21 ?1752 11/08/21 ?1302  11/09/21 ?0507  ?NA 143 142 142  ?K 3.2* 3.2* 3.5  ?CL 106 110 110  ?CO2 28 28 27   ?GLUCOSE 166* 109* 97  ?BUN 15 15 12   ?CREATININE 1.40* 1.21 1.17  ?CALCIUM 9.9 8.9 8.4*  ?MG  --  1.9  --   ?PHOS  --  2.8  --   ? ?Liver Function Tests: ?Recent Labs  ?Lab 11/07/21 ?1752 11/08/21 ?1302 11/09/21 ?0507  ?AST 25 27 26   ?ALT 32 34 32  ?ALKPHOS 61 59 54  ?BILITOT 0.5 0.7 0.5  ?PROT 7.1 6.8 6.7  ?ALBUMIN 4.2 3.7 3.6  ? ?CBG: ?No results for input(s): GLUCAP in the last 168 hours. ? ?Discharge time spent: less than 30 minutes. ? ?Signed: ?11/10/21, MD ?[facility] ?11/09/2021 ?

## 2021-11-09 NOTE — Discharge Instructions (Signed)
Stop using Marlin Canary Powders ?

## 2021-11-09 NOTE — Anesthesia Procedure Notes (Signed)
Procedure Name: Rice ?Date/Time: 11/09/2021 12:18 PM ?Performed by: Cynda Familia, CRNA ?Pre-anesthesia Checklist: Patient identified, Emergency Drugs available, Suction available, Patient being monitored and Timeout performed ?Oxygen Delivery Method: Simple face mask ?Placement Confirmation: positive ETCO2 and breath sounds checked- equal and bilateral ?Dental Injury: Teeth and Oropharynx as per pre-operative assessment  ? ? ? ? ?

## 2021-11-09 NOTE — Progress Notes (Signed)
Pt discharged home. AVS printed and educational teaching completed with teach back method. PIV removed. VSS. Pt refused Child psychotherapist consult. Pt stated that he will follow up with his current PCP. Pt has all belongings.  ?

## 2021-11-09 NOTE — Op Note (Signed)
[facility] ?Patient Name: Ricky Atkins ?Procedure Date: 11/09/2021 ?MRN: [id number] ?Attending MD: Kerin Salen , MD ?Date of Birth: [birth year] ?CSN: [id number] ?Age: 36 ?Admit Type: Inpatient ?Procedure:                Upper GI endoscopy ?Indications:              Coffee-ground emesis, Melena ?Providers:                Kerin Salen, MD, Debi Mays, RN, Janie Billups,  ?                          Technician, Heron Nay, CRNA ?Referring MD:             [facility] ?Medicines:                Monitored Anesthesia Care ?Complications:            No immediate complications. Estimated blood loss:  ?                          None. ?Estimated Blood Loss:     Estimated blood loss: none. ?Procedure:                Pre-Anesthesia Assessment: ?                          - Prior to the procedure, a History and Physical  ?                          was performed, and patient medications and  ?                          allergies were reviewed. The patient's tolerance of  ?                          previous anesthesia was also reviewed. The risks  ?                          and benefits of the procedure and the sedation  ?                          options and risks were discussed with the patient.  ?                          All questions were answered, and informed consent  ?                          was obtained. Prior Anticoagulants: The patient has  ?                          taken no previous anticoagulant or antiplatelet  ?                          agents. ASA Grade Assessment: II - A patient with  ?  mild systemic disease. After reviewing the risks  ?                          and benefits, the patient was deemed in  ?                          satisfactory condition to undergo the procedure. ?                          After obtaining informed consent, the endoscope was  ?                          passed under direct vision. Throughout the  ?                          procedure, the  patient's blood pressure, pulse, and  ?                          oxygen saturations were monitored continuously. The  ?                          GIF-[id number] ([id number]) Olympus endoscope was introduced  ?                          through the mouth, and advanced to the second part  ?                          of duodenum. The upper GI endoscopy was  ?                          accomplished without difficulty. The patient  ?                          tolerated the procedure well. ?Scope In: ?Scope Out: ?Findings: ?     The upper third of the esophagus and middle third of the esophagus were  ?     normal. ?     One linear and superficial esophageal ulcer with no bleeding and no  ?     stigmata of recent bleeding was found 39 to 40 cm from the incisors. The  ?     lesion was 10 mm in largest dimension, compatible with a clean based  ?     ulcer- Mallory Weiss tear. ?     A 5 cm hiatal hernia was present. ?     Patchy moderately erythematous mucosa without bleeding was found in the  ?     gastric body and on the greater curvature of the stomach, likley related  ?     to retching ?     The cardia and gastric fundus were normal on retroflexion. ?     The examined duodenum was normal. ?Impression:               - Normal upper third of esophagus and middle third  ?                          of esophagus. ?                          -  Esophageal ulcer with no bleeding and no stigmata  ?                          of recent bleeding- clean based, Mallor Weiss tear. ?                          - 5 cm hiatal hernia. ?                          - Erythematous mucosa in the gastric body and  ?                          greater curvature. ?                          - Normal examined duodenum. ?                          - No specimens collected. ?Moderate Sedation: ?     Patient did not receive moderate sedation for this procedure, but  ?     instead received monitored anesthesia care. ?Recommendation:           - Resume regular diet. ?                           - Use Protonix (pantoprazole) 40 mg PO daily for 1  ?                          month. ?                          - No aspirin, ibuprofen, naproxen, or other  ?                          non-steroidal anti-inflammatory drugs. ?Procedure Code(s):        --- Professional --- ?                          [phone], Esophagogastroduodenoscopy, flexible,  ?                          transoral; diagnostic, including collection of  ?                          specimen(s) by brushing or washing, when performed  ?                          (separate procedure) ?Diagnosis Code(s):        --- Professional --- ?                          K22.10, Ulcer of esophagus without bleeding ?                          K44.9, Diaphragmatic hernia without obstruction or  ?  gangrene ?                          K31.89, Other diseases of stomach and duodenum ?                          K92.0, Hematemesis ?                          K92.1, Melena (includes Hematochezia) ?CPT copyright 2019 [employer]. All rights reserved. ?The codes documented in this report are preliminary and upon coder review may  ?be revised to meet current compliance requirements. ?Kerin Salen, MD ?11/09/2021 12:35:48 PM ?This report has been signed electronically. ?Number of Addenda: 0 ?

## 2021-11-09 NOTE — Transfer of Care (Signed)
Immediate Anesthesia Transfer of Care Note ? ?Patient: Ricky Atkins ? ?Procedure(s) Performed: ESOPHAGOGASTRODUODENOSCOPY (EGD) ? ?Patient Location: PACU and Endoscopy Unit ? ?Anesthesia Type:MAC ? ?Level of Consciousness: awake and alert  ? ?Airway & Oxygen Therapy: Patient Spontanous Breathing and Patient connected to face mask oxygen ? ?Post-op Assessment: Report given to RN and Post -op Vital signs reviewed and stable ? ?Post vital signs: Reviewed and stable ? ?Last Vitals:  ?Vitals Value Taken Time  ?BP    ?Temp 36.7 ?C 11/09/21 1242  ?Pulse 54 11/09/21 1242  ?Resp 17 11/09/21 1242  ?SpO2 99 % 11/09/21 1242  ? ? ?Last Pain:  ?Vitals:  ? 11/09/21 1242  ?TempSrc: Temporal  ?PainSc:   ?   ? ?Patients Stated Pain Goal: 0 (11/08/21 2208) ? ?Complications: No notable events documented. ?

## 2021-11-09 NOTE — Anesthesia Preprocedure Evaluation (Addendum)
Anesthesia Evaluation  ?Patient identified by MRN, date of birth, ID band ?Patient awake ? ? ? ?Reviewed: ?Allergy & Precautions, NPO status , Patient's Chart, lab work & pertinent test results ? ?History of Anesthesia Complications ?Negative for: history of anesthetic complications ? ?Airway ?Mallampati: II ? ?TM Distance: >3 FB ?Neck ROM: Full ? ? ? Dental ? ?(+) Poor Dentition, Missing,  ?  ?Pulmonary ?Current Smoker,  ?  ?Pulmonary exam normal ? ? ? ? ? ? ? Cardiovascular ?negative cardio ROS ?Normal cardiovascular exam ? ? ?  ?Neuro/Psych ?Bipolar Disorder negative neurological ROS ?   ? GI/Hepatic ?(+)  ?  ? substance abuse (patient states last drug use "one year ago") ? IV drug use, melena, hematochezia ?  ?Endo/Other  ?negative endocrine ROS ? Renal/GU ?negative Renal ROS  ?negative genitourinary ?  ?Musculoskeletal ?negative musculoskeletal ROS ?(+) narcotic dependent ? Abdominal ?  ?Peds ? Hematology ? ?(+) Blood dyscrasia, anemia , Hgb 11.2, Plt 130k   ?Anesthesia Other Findings ?Day of surgery medications reviewed with patient. ? Reproductive/Obstetrics ?negative OB ROS ? ?  ? ? ? ? ? ? ? ? ? ? ? ? ? ?  ?  ? ? ? ? ? ? ? ?Anesthesia Physical ?Anesthesia Plan ? ?ASA: 3 ? ?Anesthesia Plan: MAC  ? ?Post-op Pain Management: Minimal or no pain anticipated  ? ?Induction:  ? ?PONV Risk Score and Plan: Treatment may vary due to age or medical condition and Propofol infusion ? ?Airway Management Planned: Natural Airway and Nasal Cannula ? ?Additional Equipment: None ? ?Intra-op Plan:  ? ?Post-operative Plan:  ? ?Informed Consent: I have reviewed the patients History and Physical, chart, labs and discussed the procedure including the risks, benefits and alternatives for the proposed anesthesia with the patient or authorized representative who has indicated his/her understanding and acceptance.  ? ? ? ? ? ?Plan Discussed with: CRNA ? ?Anesthesia Plan Comments:    ? ? ? ? ? ?Anesthesia Quick Evaluation ? ?

## 2021-11-09 NOTE — Anesthesia Postprocedure Evaluation (Signed)
Anesthesia Post Note ? ?Patient: Ricky Atkins ? ?Procedure(s) Performed: ESOPHAGOGASTRODUODENOSCOPY (EGD) ? ?  ? ?Patient location during evaluation: PACU ?Anesthesia Type: MAC ?Level of consciousness: awake and alert ?Pain management: pain level controlled ?Vital Signs Assessment: post-procedure vital signs reviewed and stable ?Respiratory status: spontaneous breathing, nonlabored ventilation and respiratory function stable ?Cardiovascular status: blood pressure returned to baseline ?Postop Assessment: no apparent nausea or vomiting ?Anesthetic complications: no ? ? ?No notable events documented. ? ?Last Vitals:  ?Vitals:  ? 11/09/21 1310 11/09/21 1408  ?BP: 133/76 (!) 144/96  ?Pulse: (!) 52 (!) 51  ?Resp: 17 15  ?Temp:  36.7 ?C  ?SpO2: 100% 100%  ?  ?Last Pain:  ?Vitals:  ? 11/09/21 1408  ?TempSrc: Oral  ?PainSc:   ? ? ?  ?  ?  ?  ?  ?  ? ?Marthenia Rolling ? ? ? ? ?
